# Patient Record
Sex: Female | Born: 1965 | Race: White | Hispanic: No | Marital: Single | Smoking: Never smoker
Health system: Southern US, Community
[De-identification: ages and names within clinical notes are randomized; demographics above are authoritative.]

## PROBLEM LIST (undated history)

## (undated) DIAGNOSIS — I219 Acute myocardial infarction, unspecified: Secondary | ICD-10-CM

## (undated) DIAGNOSIS — F329 Major depressive disorder, single episode, unspecified: Secondary | ICD-10-CM

## (undated) DIAGNOSIS — I10 Essential (primary) hypertension: Secondary | ICD-10-CM

## (undated) DIAGNOSIS — F419 Anxiety disorder, unspecified: Secondary | ICD-10-CM

## (undated) DIAGNOSIS — F32A Depression, unspecified: Secondary | ICD-10-CM

## (undated) DIAGNOSIS — I471 Supraventricular tachycardia, unspecified: Secondary | ICD-10-CM

## (undated) DIAGNOSIS — G44009 Cluster headache syndrome, unspecified, not intractable: Secondary | ICD-10-CM

## (undated) DIAGNOSIS — I2510 Atherosclerotic heart disease of native coronary artery without angina pectoris: Secondary | ICD-10-CM

## (undated) DIAGNOSIS — E785 Hyperlipidemia, unspecified: Secondary | ICD-10-CM

## (undated) HISTORY — DX: Essential (primary) hypertension: I10

## (undated) HISTORY — DX: Hyperlipidemia, unspecified: E78.5

## (undated) HISTORY — DX: Anxiety disorder, unspecified: F41.9

## (undated) HISTORY — DX: Acute myocardial infarction, unspecified: I21.9

## (undated) HISTORY — DX: Supraventricular tachycardia: I47.1

## (undated) HISTORY — PX: MENISCUS REPAIR: SHX5179

## (undated) HISTORY — PX: OTHER SURGICAL HISTORY: SHX169

## (undated) HISTORY — DX: Major depressive disorder, single episode, unspecified: F32.9

## (undated) HISTORY — DX: Cluster headache syndrome, unspecified, not intractable: G44.009

## (undated) HISTORY — DX: Depression, unspecified: F32.A

## (undated) HISTORY — DX: Atherosclerotic heart disease of native coronary artery without angina pectoris: I25.10

## (undated) HISTORY — DX: Supraventricular tachycardia, unspecified: I47.10

---

## 1999-10-28 ENCOUNTER — Other Ambulatory Visit: Admission: RE | Admit: 1999-10-28 | Discharge: 1999-10-28 | Payer: Self-pay | Admitting: Internal Medicine

## 2002-11-22 ENCOUNTER — Emergency Department (HOSPITAL_COMMUNITY): Admission: EM | Admit: 2002-11-22 | Discharge: 2002-11-22 | Payer: Self-pay | Admitting: Emergency Medicine

## 2002-11-22 ENCOUNTER — Encounter: Payer: Self-pay | Admitting: Emergency Medicine

## 2004-01-14 ENCOUNTER — Other Ambulatory Visit: Admission: RE | Admit: 2004-01-14 | Discharge: 2004-01-14 | Payer: Self-pay | Admitting: Gynecology

## 2011-01-06 ENCOUNTER — Other Ambulatory Visit: Payer: Self-pay | Admitting: Family Medicine

## 2011-01-06 ENCOUNTER — Ambulatory Visit
Admission: RE | Admit: 2011-01-06 | Discharge: 2011-01-06 | Disposition: A | Discharge status: Home / Self Care | Payer: BC Managed Care – PPO | Source: Ambulatory Visit | Attending: Family Medicine | Admitting: Family Medicine

## 2011-01-06 DIAGNOSIS — R1011 Right upper quadrant pain: Secondary | ICD-10-CM

## 2011-10-06 ENCOUNTER — Encounter: Payer: BC Managed Care – PPO | Admitting: Physician Assistant

## 2011-10-11 DIAGNOSIS — G44009 Cluster headache syndrome, unspecified, not intractable: Secondary | ICD-10-CM | POA: Insufficient documentation

## 2011-10-11 DIAGNOSIS — I10 Essential (primary) hypertension: Secondary | ICD-10-CM | POA: Insufficient documentation

## 2011-10-12 ENCOUNTER — Ambulatory Visit (INDEPENDENT_AMBULATORY_CARE_PROVIDER_SITE_OTHER): Payer: BC Managed Care – PPO | Admitting: Gynecology

## 2011-10-12 ENCOUNTER — Encounter: Payer: Self-pay | Admitting: Gynecology

## 2011-10-12 ENCOUNTER — Other Ambulatory Visit (HOSPITAL_COMMUNITY)
Admission: RE | Admit: 2011-10-12 | Discharge: 2011-10-12 | Disposition: A | Discharge status: Home / Self Care | Payer: BC Managed Care – PPO | Source: Ambulatory Visit | Attending: Gynecology | Admitting: Gynecology

## 2011-10-12 VITALS — BP 150/96 | Ht 60.5 in | Wt 166.0 lb

## 2011-10-12 DIAGNOSIS — Z01419 Encounter for gynecological examination (general) (routine) without abnormal findings: Secondary | ICD-10-CM

## 2011-10-12 DIAGNOSIS — E785 Hyperlipidemia, unspecified: Secondary | ICD-10-CM | POA: Insufficient documentation

## 2011-10-12 NOTE — Patient Instructions (Signed)
Contraceptive Devices (IUD) IUD stands for intrauterine device. Intrauterine means inside the womb (uterus). The purpose of the IUD is to prevent pregnancy. IUDs make it more difficult for your partner's sperm to get into your womb and into your fallopian tubes, where the eggs are fertilized. IUDs also alter the secretions of your cervix, which make it a stronger sperm barrier. They also affect the lining of the womb, so it is harder for an egg to implant. The IUD does not cause an abortion. There are 2 types of IUDs available:  Copper IUD gives off a small amount of copper inside the uterus. This prevents the sperm from going through the uterus, up into the fallopian tube, where the egg is fertilized. The copper IUD can also damage or prevent the fertilized egg from attaching on the inside lining of the uterus. It can stay in place for 10 years. The copper IUD can be used as an emergency contraceptive, if inserted within 5 days after having unprotected sexual intercourse.   Hormone IUD contains progestin (synthetic progesterone), and it releases this hormone into the uterus. The hormone thickens the mucus on the cervix and prevents sperm from entering the uterus. It also weakens the sperm that get into the uterus, so that the sperm and egg cannot live in the fallopian tube. It also makes the inside lining of the uterus thinner, which makes it difficult for a fertilized egg to attach to the uterus. The hormone progestin in the IUD decreases the amount of bleeding during a menstrual period and can be helpful in women who have heavy menstrual periods. The hormone IUD can stay in place for 5 years.  SIDE EFFECTS OF THE IUD:  There may be more cramping or pain with periods.   It may cause heavier, longer periods, which can cause lack of red blood cells (anemia) and can interfere with your daily and sexual activities.  This method of birth control is not usually the best choice for a woman with heavy or  prolonged periods. The birth control pill may be a better choice. IUDs work best for women who have already had a pregnancy, because the cervix is more open, making the insertion of the device easier and less painful. However, many women without children use the IUD. One of the main goals of patient selection is to prevent unintentionally inserting an IUD into a patient who has an STD (sexually transmitted disease), who is at high risk of exposure to an STD, or who is already pregnant. That is why the IUD is inserted during, or right after, a menstrual period. REASONS NOT TO USE AN IUD:  The womb or cervix is not shaped normally.   You have or have had a pelvic infection, such as an STD, in the past 3 months.   You have or suspect cancer in the female organs.   You have an abnormal Pap smear.   You have certain liver diseases.   There is severe infection or inflammation of the cervix (cervicitis).   You have unexplained vaginal bleeding.   You have heart valve problems (unless a heart specialist advises otherwise).   You are allergic to copper (rare).   You previously had a pregnancy outside the uterus (ectopic).   You are pregnant or suspect you are pregnant.   You have prolonged or heavy periods, or heavy pain or cramping with periods (except for the hormone IUD).   You have or suspect pelvic cancer.   You have an   STD.   The cervix or uterus has problems (cervical stenosis, fibroids in the uterus) making it difficult to insert the IUD.  IUDs should be removed when a woman becomes menopausal or pregnant. BENEFITS OF THE IUD:  You are always ready to have sexual intercourse.   The copper IUD does not interfere with your female hormones.   The copper IUD can be used as emergency contraception.   An IUD can be used while nursing.   It works immediately after insertion, and there is no problem getting pregnant when it is removed.   It does not interfere with foreplay.    The progesterone IUD can make heavy menstrual periods lighter.   The progesterone IUD can be used for 5 years.   The copper IUD can be used for 10 years.  RISKS AND COMPLICATIONS  Putting the IUD through the uterus, into the pelvis or abdomen (perforation of the uterus).   Losing the IUD (expulsion). This is more common in women who never had children.   When pregnant with an IUD, there is an increased chance of an infection and loss of the pregnancy.   Pregnancy in the fallopian tube (ectopic).   STD, in women who have more than 1 sex partner. The IUD does not protect against STDs.   Other minor side effects may include:   Headaches.   Feeling sick to your stomach (nausea).   Breast tenderness.   Depression.  PROCEDURE   The IUD is inserted during or right after a menstrual period, to make sure you are not pregnant.   You will lie on an exam table, naked from the waist down.   Your caregiver will do an exam to determine the size and position of your uterus.   Usually, an anesthetic is not needed.   Your caregiver may give you a pain pill to take, 1 or 2 hours before the procedure.   Sometimes, a paracervical block may be used to block and control any discomfort with insertion.   A tool (speculum) is then placed in your vagina (birth canal) so your caregiver can see the cervix.   A sound is sent into the uterus to check the depth of the uterus.   A slim instrument (IUD inserter), which is shaped like a drinking straw, is inserted through the small opening in your cervix and into your uterus.   Then, the IUD is pushed in with a plunger, much like a syringe, and the inserter is removed. There may be some cramping and pain during the insertion.   Relaxing helps to lessen the discomfort.   Following the procedure, you will usually spot blood. Have some pads with you. Avoid using tampons for 2 weeks. Bleeding after the procedure is normal. It varies from light  spotting for a few days to menstrual-like bleeding for up to 3 weeks. It may be like a period if it is near the time for your normal period.   You may also have mild cramping. Only take over-the-counter or prescription medicines for pain, discomfort, or fever as directed by your caregiver. Do not use aspirin, as this may increase bleeding.   Practice checking the string coming out of the cervix, to make sure the IUD is always in the uterus.  HOME CARE INSTRUCTIONS   Do not drive for 24 hours.   Only take over-the-counter or prescription medicines for pain, discomfort, or fever as directed by your caregiver.   No tampons, douching, or sexual intercourse for   2 weeks, or until your caregiver approves.   Rest at home for 24 hours. You may then resume normal activities, unless told otherwise by your caregiver.   Check your IUD prior to resuming sexual activity, to make sure it is in place. Make sure that you can feel the strings. An IUD can be pushed out and lost without the user even knowing it is gone. Also, if the strings are getting longer, it may mean that the IUD is being forced out of the uterus. This means it is no longer offering full protection from pregnancy.   Take any medications your caregiver has ordered, as directed.   Make sure to keep your recheck appointment, so your caregiver can make sure your IUD has remained in place. After that exam, yearly exams are advised, unless you cannot feel the strings of your IUD.   Check that the IUD is still in place by feeling for the strings after every menstrual period.  SEEK MEDICAL CARE IF:   Bleeding is heavier than a normal menstrual cycle.   You have an oral temperature above 102 F (38.9 C).   You have increasing cramps or pains, not relieved with medicine. Or you develop belly (abdominal) pain that does not seem to be related to the same area of earlier cramping and pain.   You are lightheaded, unusually weak, or have fainting  episodes.   You develop pain in the tops of your shoulders (shoulder strap areas).   You are having problems or questions, which have not been answered well enough by your caregiver.   You develop abdominal pain.   You have pain during sexual intercourse.   You cannot feel the IUD strings.   You have abnormal vaginal discharge.   You feel the IUD at the opening of the cervix in the vagina.   You think you are pregnant.   You miss your menstrual period.   The IUD string is hurting your sex partner.   The IUD string has gotten longer.  Document Released: 07/25/2004 Document Revised: 12/06/2010 Document Reviewed: 09/06/2009 [employer] Patient Information 2012 [employer]. 

## 2011-10-12 NOTE — Progress Notes (Signed)
Teresa Wolf 1966-06-11 [id number]   History:    46 y.o.  for annual exam has not been seen the office since 2005. Patient has history of depression and has been followed in the past by Dr. Cleone Slim and her primary provider recently did all her lab work in his been treating her for hypercholesterolemia and hypertension. Patient is interested in the IUD and came for her annual exam as well as to discuss contraceptive options. She has never had a prior mammogram although she does her monthly self breast examination. Her cycles are regular.  Past medical history,surgical history, family history and social history were all reviewed and documented in the EPIC chart.  Gynecologic History Patient's last menstrual period was 09/17/2011. Contraception: none Last Pap: 2005. Results were: normal Last mammogram: No prior study. Results were: No prior study  Obstetric History OB History    Grav Para Term Preterm Abortions TAB SAB Ect Mult Living   1 1  1      1      # Outc Date GA Lbr Len/2nd Wgt Sex Del Anes PTL Lv   1 PRE     F CS  Yes Yes       ROS:  Was performed and pertinent positives and negatives are included in the history.  Exam: chaperone present  BP 150/96  Ht 5' 0.5" (1.537 m)  Wt 166 lb (75.297 kg)  BMI 31.89 kg/m2  LMP 09/17/2011  Body mass index is 31.89 kg/(m^2).  General appearance : Well developed well nourished female. No acute distress HEENT: Neck supple, trachea midline, no carotid bruits, no thyroidmegaly Lungs: Clear to auscultation, no rhonchi or wheezes, or rib retractions  Heart: Regular rate and rhythm, no murmurs or gallops Breast:Examined in sitting and supine position were symmetrical in appearance, no palpable masses or tenderness,  no skin retraction, no nipple inversion, no nipple discharge, no skin discoloration, no axillary or supraclavicular lymphadenopathy Abdomen: no palpable masses or tenderness, no rebound or guarding Extremities: no edema or skin  discoloration or tenderness  Pelvic:  Bartholin, Urethra, Skene Glands: Within normal limits             Vagina: No gross lesions or discharge  Cervix: No gross lesions or discharge  Uterus  anteverted, normal size, shape and consistency, non-tender and mobile  Adnexa  Without masses or tenderness  Anus and perineum  normal   Rectovaginal  normal sphincter tone without palpated masses or tenderness             Hemoccult not done     Assessment/Plan:  46 y.o. female for annual exam unremarkable. We discussed different types of IUDs. She is interested in the ParaGard T380A IUD for which literature formation was provided. Risks benefits and pros and cons as well as failure rates were discussed. Her Pap smear was done today but no lab work. She was given a requisition to schedule her mammogram. We'll see her at the end of the month and she start her menses to place the IUD.    Ok Edwards MD, 12:42 PM 10/12/2011

## 2011-10-18 ENCOUNTER — Inpatient Hospital Stay (HOSPITAL_COMMUNITY): Payer: BC Managed Care – PPO

## 2011-10-18 ENCOUNTER — Other Ambulatory Visit: Payer: Self-pay

## 2011-10-18 ENCOUNTER — Encounter (HOSPITAL_COMMUNITY): Payer: Self-pay | Admitting: Emergency Medicine

## 2011-10-18 ENCOUNTER — Inpatient Hospital Stay (HOSPITAL_COMMUNITY)
Admission: EM | Admit: 2011-10-18 | Discharge: 2011-10-20 | DRG: 550 | Disposition: A | Discharge status: Home / Self Care | Payer: BC Managed Care – PPO | Source: Ambulatory Visit | Attending: Cardiovascular Disease | Admitting: Cardiovascular Disease

## 2011-10-18 ENCOUNTER — Encounter (HOSPITAL_COMMUNITY)
Admission: EM | Disposition: A | Discharge status: Home / Self Care | Payer: Self-pay | Source: Ambulatory Visit | Attending: Cardiovascular Disease

## 2011-10-18 DIAGNOSIS — I2109 ST elevation (STEMI) myocardial infarction involving other coronary artery of anterior wall: Principal | ICD-10-CM | POA: Diagnosis present

## 2011-10-18 DIAGNOSIS — E785 Hyperlipidemia, unspecified: Secondary | ICD-10-CM | POA: Insufficient documentation

## 2011-10-18 DIAGNOSIS — I213 ST elevation (STEMI) myocardial infarction of unspecified site: Secondary | ICD-10-CM

## 2011-10-18 DIAGNOSIS — F3289 Other specified depressive episodes: Secondary | ICD-10-CM | POA: Diagnosis present

## 2011-10-18 DIAGNOSIS — D72829 Elevated white blood cell count, unspecified: Secondary | ICD-10-CM | POA: Diagnosis not present

## 2011-10-18 DIAGNOSIS — Z79899 Other long term (current) drug therapy: Secondary | ICD-10-CM

## 2011-10-18 DIAGNOSIS — E7800 Pure hypercholesterolemia, unspecified: Secondary | ICD-10-CM

## 2011-10-18 DIAGNOSIS — I471 Supraventricular tachycardia, unspecified: Secondary | ICD-10-CM | POA: Diagnosis not present

## 2011-10-18 DIAGNOSIS — I2582 Chronic total occlusion of coronary artery: Secondary | ICD-10-CM | POA: Diagnosis present

## 2011-10-18 DIAGNOSIS — J189 Pneumonia, unspecified organism: Secondary | ICD-10-CM | POA: Diagnosis not present

## 2011-10-18 DIAGNOSIS — Z7982 Long term (current) use of aspirin: Secondary | ICD-10-CM

## 2011-10-18 DIAGNOSIS — I059 Rheumatic mitral valve disease, unspecified: Secondary | ICD-10-CM | POA: Diagnosis present

## 2011-10-18 DIAGNOSIS — I2510 Atherosclerotic heart disease of native coronary artery without angina pectoris: Secondary | ICD-10-CM

## 2011-10-18 DIAGNOSIS — I498 Other specified cardiac arrhythmias: Secondary | ICD-10-CM | POA: Diagnosis not present

## 2011-10-18 DIAGNOSIS — Z7902 Long term (current) use of antithrombotics/antiplatelets: Secondary | ICD-10-CM

## 2011-10-18 DIAGNOSIS — I10 Essential (primary) hypertension: Secondary | ICD-10-CM | POA: Insufficient documentation

## 2011-10-18 DIAGNOSIS — Z880 Allergy status to penicillin: Secondary | ICD-10-CM

## 2011-10-18 DIAGNOSIS — F411 Generalized anxiety disorder: Secondary | ICD-10-CM | POA: Diagnosis present

## 2011-10-18 DIAGNOSIS — F329 Major depressive disorder, single episode, unspecified: Secondary | ICD-10-CM | POA: Diagnosis present

## 2011-10-18 HISTORY — PX: PERCUTANEOUS CORONARY STENT INTERVENTION (PCI-S): SHX5485

## 2011-10-18 HISTORY — PX: LEFT HEART CATHETERIZATION WITH CORONARY ANGIOGRAM: SHX5451

## 2011-10-18 LAB — BASIC METABOLIC PANEL
BUN: 6 mg/dL (ref 6–23)
Calcium: 8.7 mg/dL (ref 8.4–10.5)
Creatinine, Ser: 0.62 mg/dL (ref 0.50–1.10)
GFR calc Af Amer: >90 mL/min (ref >90)
GFR calc non Af Amer: >90 mL/min (ref >90)

## 2011-10-18 LAB — CBC
HCT: 38.6 % (ref 36.0–46.0)
MCHC: 33.2 g/dL (ref 30.0–36.0)
MCV: 85 fL (ref 78.0–100.0)
RDW: 12.7 % (ref 11.5–15.5)

## 2011-10-18 LAB — POCT I-STAT, CHEM 8
Creatinine, Ser: 0.7 mg/dL (ref 0.50–1.10)
Hemoglobin: 13.9 g/dL (ref 12.0–15.0)
Sodium: 139 mEq/L (ref 135–145)
TCO2: 19 mmol/L (ref 0–100)

## 2011-10-18 LAB — GLUCOSE, CAPILLARY: Glucose-Capillary: 187 mg/dL — ABNORMAL HIGH (ref 70–99)

## 2011-10-18 LAB — LIPID PANEL
HDL: 46 mg/dL (ref >39)
LDL Cholesterol: 119 mg/dL — ABNORMAL HIGH (ref 0–99)
Triglycerides: 76 mg/dL (ref <150)

## 2011-10-18 LAB — POCT ACTIVATED CLOTTING TIME: Activated Clotting Time: 441 seconds

## 2011-10-18 SURGERY — LEFT HEART CATHETERIZATION WITH CORONARY ANGIOGRAM
Anesthesia: LOCAL

## 2011-10-18 MED ORDER — METOPROLOL TARTRATE 1 MG/ML IV SOLN
5.0000 mg | Freq: Once | INTRAVENOUS | Status: AC
  Administered 2011-10-18: 5 mg via INTRAVENOUS

## 2011-10-18 MED ORDER — HEPARIN (PORCINE) IN NACL 2-0.9 UNIT/ML-% IJ SOLN
INTRAMUSCULAR | Status: AC
  Filled 2011-10-18: qty 2000

## 2011-10-18 MED ORDER — ACETAMINOPHEN 325 MG PO TABS: 650.0000 mg | ORAL_TABLET | ORAL | Status: DC | PRN

## 2011-10-18 MED ORDER — ASPIRIN 300 MG RE SUPP
300.0000 mg | RECTAL | Status: AC
  Filled 2011-10-18: qty 1

## 2011-10-18 MED ORDER — CITALOPRAM HYDROBROMIDE 20 MG PO TABS
20.0000 mg | ORAL_TABLET | Freq: Every day | ORAL | Status: DC
  Administered 2011-10-18 – 2011-10-20 (×3): 20 mg via ORAL
  Filled 2011-10-18 (×3): qty 1

## 2011-10-18 MED ORDER — HEPARIN SODIUM (PORCINE) 5000 UNIT/ML IJ SOLN
INTRAMUSCULAR | Status: AC
  Administered 2011-10-18: 5000 [IU] via INTRAVENOUS
  Filled 2011-10-18: qty 1

## 2011-10-18 MED ORDER — BIVALIRUDIN 250 MG IV SOLR
INTRAVENOUS | Status: AC
  Filled 2011-10-18: qty 250

## 2011-10-18 MED ORDER — SODIUM CHLORIDE 0.9 % IV SOLN: 250.0000 mL | INTRAVENOUS | Status: DC

## 2011-10-18 MED ORDER — METOPROLOL TARTRATE 1 MG/ML IV SOLN
INTRAVENOUS | Status: AC
  Filled 2011-10-18: qty 5

## 2011-10-18 MED ORDER — FENTANYL CITRATE 0.05 MG/ML IJ SOLN
INTRAMUSCULAR | Status: AC
  Filled 2011-10-18: qty 2

## 2011-10-18 MED ORDER — LIDOCAINE HCL (PF) 1 % IJ SOLN
INTRAMUSCULAR | Status: AC
  Filled 2011-10-18: qty 30

## 2011-10-18 MED ORDER — ZOLPIDEM TARTRATE 5 MG PO TABS
10.0000 mg | ORAL_TABLET | Freq: Every evening | ORAL | Status: DC | PRN
  Administered 2011-10-18: 10 mg via ORAL
  Filled 2011-10-18: qty 2

## 2011-10-18 MED ORDER — ASPIRIN EC 81 MG PO TBEC: 81.0000 mg | DELAYED_RELEASE_TABLET | Freq: Every day | ORAL | Status: DC

## 2011-10-18 MED ORDER — LORAZEPAM 0.5 MG PO TABS
1.0000 mg | ORAL_TABLET | Freq: Two times a day (BID) | ORAL | Status: DC
  Administered 2011-10-18 – 2011-10-20 (×5): 1 mg via ORAL
  Filled 2011-10-18: qty 1
  Filled 2011-10-18: qty 2
  Filled 2011-10-18: qty 1
  Filled 2011-10-18: qty 2
  Filled 2011-10-18: qty 1

## 2011-10-18 MED ORDER — ADENOSINE 12 MG/4ML IV SOLN
12.0000 mg | Freq: Once | INTRAVENOUS | Status: AC
  Administered 2011-10-18: 12 mg via INTRAVENOUS

## 2011-10-18 MED ORDER — ASPIRIN EC 325 MG PO TBEC
325.0000 mg | DELAYED_RELEASE_TABLET | Freq: Every day | ORAL | Status: DC
  Administered 2011-10-18: 325 mg via ORAL
  Filled 2011-10-18: qty 1

## 2011-10-18 MED ORDER — SODIUM CHLORIDE 0.9 % IJ SOLN: 3.0000 mL | INTRAMUSCULAR | Status: DC | PRN

## 2011-10-18 MED ORDER — MIDAZOLAM HCL 2 MG/2ML IJ SOLN
INTRAMUSCULAR | Status: AC
  Filled 2011-10-18: qty 2

## 2011-10-18 MED ORDER — ONDANSETRON HCL 4 MG/2ML IJ SOLN
INTRAMUSCULAR | Status: AC
  Administered 2011-10-18: 4 mg
  Filled 2011-10-18: qty 2

## 2011-10-18 MED ORDER — ADENOSINE 6 MG/2ML IV SOLN
INTRAVENOUS | Status: AC
  Filled 2011-10-18: qty 2

## 2011-10-18 MED ORDER — NITROGLYCERIN IN D5W 200-5 MCG/ML-% IV SOLN
INTRAVENOUS | Status: AC
  Filled 2011-10-18: qty 250

## 2011-10-18 MED ORDER — METOPROLOL SUCCINATE ER 50 MG PO TB24: 50.0000 mg | ORAL_TABLET | Freq: Every day | ORAL | Status: DC

## 2011-10-18 MED ORDER — NITROGLYCERIN 0.2 MG/ML ON CALL CATH LAB
INTRAVENOUS | Status: AC
  Filled 2011-10-18: qty 1

## 2011-10-18 MED ORDER — METOPROLOL TARTRATE 50 MG PO TABS
50.0000 mg | ORAL_TABLET | Freq: Two times a day (BID) | ORAL | Status: DC
  Administered 2011-10-18 – 2011-10-20 (×5): 50 mg via ORAL
  Filled 2011-10-18 (×7): qty 1

## 2011-10-18 MED ORDER — METOPROLOL SUCCINATE ER 50 MG PO TB24
50.0000 mg | ORAL_TABLET | Freq: Every day | ORAL | Status: DC
  Administered 2011-10-18: 50 mg via ORAL
  Filled 2011-10-18: qty 1

## 2011-10-18 MED ORDER — ASPIRIN 81 MG PO CHEW
CHEWABLE_TABLET | ORAL | Status: AC
  Filled 2011-10-18: qty 4

## 2011-10-18 MED ORDER — ASPIRIN 81 MG PO CHEW
81.0000 mg | CHEWABLE_TABLET | Freq: Every day | ORAL | Status: DC
  Administered 2011-10-19 – 2011-10-20 (×2): 81 mg via ORAL
  Filled 2011-10-18 (×2): qty 1

## 2011-10-18 MED ORDER — ADENOSINE 6 MG/2ML IV SOLN
6.0000 mg | Freq: Once | INTRAVENOUS | Status: AC
  Administered 2011-10-18: 6 mg via INTRAVENOUS

## 2011-10-18 MED ORDER — SODIUM CHLORIDE 0.9 % IJ SOLN
3.0000 mL | Freq: Two times a day (BID) | INTRAMUSCULAR | Status: DC
  Administered 2011-10-18 – 2011-10-20 (×5): 3 mL via INTRAVENOUS

## 2011-10-18 MED ORDER — ONDANSETRON HCL 4 MG/2ML IJ SOLN: 4.0000 mg | Freq: Four times a day (QID) | INTRAMUSCULAR | Status: DC | PRN

## 2011-10-18 MED ORDER — ASPIRIN 81 MG PO CHEW
324.0000 mg | CHEWABLE_TABLET | ORAL | Status: AC
  Administered 2011-10-18: 324 mg via ORAL
  Filled 2011-10-18: qty 4

## 2011-10-18 MED ORDER — LABETALOL HCL 5 MG/ML IV SOLN
INTRAVENOUS | Status: AC
  Filled 2011-10-18: qty 4

## 2011-10-18 MED ORDER — NITROGLYCERIN 0.4 MG SL SUBL: 0.4000 mg | SUBLINGUAL_TABLET | SUBLINGUAL | Status: DC | PRN

## 2011-10-18 MED ORDER — ACETAMINOPHEN 325 MG PO TABS
650.0000 mg | ORAL_TABLET | ORAL | Status: DC | PRN
  Administered 2011-10-19: 650 mg via ORAL
  Filled 2011-10-18: qty 2

## 2011-10-18 MED ORDER — CLOPIDOGREL BISULFATE 300 MG PO TABS
ORAL_TABLET | ORAL | Status: AC
  Filled 2011-10-18: qty 2

## 2011-10-18 MED ORDER — HEPARIN SODIUM (PORCINE) 5000 UNIT/ML IJ SOLN
5000.0000 [IU] | Freq: Three times a day (TID) | INTRAMUSCULAR | Status: DC
  Administered 2011-10-18 – 2011-10-20 (×8): 5000 [IU] via SUBCUTANEOUS
  Filled 2011-10-18 (×12): qty 1

## 2011-10-18 MED ORDER — CLOPIDOGREL BISULFATE 75 MG PO TABS
75.0000 mg | ORAL_TABLET | Freq: Every day | ORAL | Status: DC
  Administered 2011-10-18 – 2011-10-20 (×3): 75 mg via ORAL
  Filled 2011-10-18 (×3): qty 1

## 2011-10-18 MED ORDER — ALPRAZOLAM 0.25 MG PO TABS
0.2500 mg | ORAL_TABLET | Freq: Two times a day (BID) | ORAL | Status: DC | PRN
  Administered 2011-10-18: 0.25 mg via ORAL
  Filled 2011-10-18: qty 1

## 2011-10-18 MED ORDER — METOPROLOL TARTRATE 1 MG/ML IV SOLN
INTRAVENOUS | Status: AC
  Administered 2011-10-18: 5 mg via INTRAVENOUS
  Filled 2011-10-18: qty 10

## 2011-10-18 MED ORDER — SODIUM CHLORIDE 0.9 % IV SOLN
1.0000 mL/kg/h | INTRAVENOUS | Status: AC
  Administered 2011-10-18: 1 mL/kg/h via INTRAVENOUS

## 2011-10-18 MED ORDER — ROSUVASTATIN CALCIUM 40 MG PO TABS
40.0000 mg | ORAL_TABLET | Freq: Every day | ORAL | Status: DC
  Administered 2011-10-18 – 2011-10-20 (×3): 40 mg via ORAL
  Filled 2011-10-18 (×3): qty 1

## 2011-10-18 MED ORDER — ADENOSINE 6 MG/2ML IV SOLN
INTRAVENOUS | Status: AC
  Administered 2011-10-18: 6 mg via INTRAVENOUS
  Filled 2011-10-18: qty 2

## 2011-10-18 MED ORDER — ASPIRIN 325 MG PO TABS: 325.0000 mg | ORAL_TABLET | Freq: Every day | ORAL | Status: DC

## 2011-10-18 NOTE — Interval H&P Note (Signed)
History and Physical Interval Note:  10/18/2011 4:17 AM  Teresa Wolf  has presented today for surgery, with the diagnosis of stemi  The various methods of treatment have been discussed with the patient and family. After consideration of risks, benefits and other options for treatment, the patient has consented to  Procedure(s) (LRB): LEFT HEART CATHETERIZATION WITH CORONARY ANGIOGRAM (N/A) PERCUTANEOUS CORONARY STENT INTERVENTION (PCI-S) () as a surgical intervention .  The patients' history has been reviewed, patient examined, no change in status, stable for surgery.  I have reviewed the patients' chart and labs.  Questions were answered to the patient's satisfaction.     Tonny Bollman

## 2011-10-18 NOTE — Progress Notes (Signed)
  Echocardiogram 2D Echocardiogram has been performed.  Teresa Wolf 10/18/2011, 2:46 PM

## 2011-10-18 NOTE — ED Notes (Signed)
Dr. Bensimohn at bedside.  

## 2011-10-18 NOTE — Progress Notes (Addendum)
PCP: [name] NP (switching)  HPI:  46 y/o woman with h/o headaches, HTN and HL. No known CAD.  Was at friend's house at 10:30p developed CP. Thought it was her gallbladder but CP persisted. Called EMS. ECG with prominent ST elevation antero-laterally with some ST elevation inferiorly. Treated with asa 325 x 4, NTG and heparin. On arrival to ER CP 1/10. Now pain free.   CATH: 2/13. Left mainstem: Widely patent with an acentric orifice, but the diarrhea flux and no pressure damping.  Left anterior descending (LAD): The LAD is widely patent in the proximal aspect. Just after the first diagonal branch, the vessel is totally occluded with appearance of thrombus. There is TIMI 0 flow.  Left circumflex (LCx): The left circumflex is widely patent. There is no significant obstructive disease. The vessel is large and smooth throughout its course. There are 2 large obtuse marginal branches with no significant stenosis.  Right coronary artery (RCA): The RCA is dominant. The vessel is smooth throughout its course. There is mild catheter induced vasospasm. The PDA is widely patent. There is no obstructive disease throughout the RCA distribution.  Left ventriculography: Left ventricular systolic function is severely reduced. There is a large area of akinesis from the mid anterior wall extending into the apex and distal inferior wall. The left ventricular ejection fraction is estimated at 35%. There is no significant mitral regurgitation.  S/p mid LAD stenting with DES.  No more CP, SOB N/V. Pt feels little anxious. Had multiple SVT episodes this am.    Past Medical History  Diagnosis Date  . Cluster headaches     sporadic  . Hypertension   . Depression   . Anxiety   . High cholesterol     Medications Prior to Admission  Medication Dose Route Frequency Provider Last Rate Last Dose  . 0.9 %  sodium chloride infusion  1 mL/kg/hr Intravenous Continuous Micheline Chapman, MD 73.6 mL/hr at  10/18/11 0445 1 mL/kg/hr at 10/18/11 0445  . 0.9 %  sodium chloride infusion  250 mL Intravenous Continuous Micheline Chapman, MD      . acetaminophen (TYLENOL) tablet 650 mg  650 mg Oral Q4H PRN Dolores Patty, MD      . acetaminophen (TYLENOL) tablet 650 mg  650 mg Oral Q4H PRN Micheline Chapman, MD      . ALPRAZolam Prudy Feeler) tablet 0.25 mg  0.25 mg Oral BID PRN Micheline Chapman, MD   0.25 mg at 10/18/11 0804  . aspirin chewable tablet 324 mg  324 mg Oral NOW Dolores Patty, MD   324 mg at 10/18/11 1610   Or  . aspirin suppository 300 mg  300 mg Rectal NOW Dolores Patty, MD      . aspirin EC tablet 325 mg  325 mg Oral Daily Micheline Chapman, MD   325 mg at 10/18/11 0926  . bivalirudin (ANGIOMAX) 250 MG injection           . citalopram (CELEXA) tablet 20 mg  20 mg Oral Daily Dolores Patty, MD   20 mg at 10/18/11 9604  . clopidogrel (PLAVIX) 300 MG tablet           . clopidogrel (PLAVIX) tablet 75 mg  75 mg Oral Q breakfast Micheline Chapman, MD   75 mg at 10/18/11 0804  . fentaNYL (SUBLIMAZE) 0.05 MG/ML injection           . heparin 2-0.9 UNIT/ML-%  infusion           . heparin 5000 UNIT/ML injection        5,000 Units at 10/18/11 0253  . heparin injection 5,000 Units  5,000 Units Subcutaneous Q8H Dolores Patty, MD   5,000 Units at 10/18/11 0630  . labetalol (NORMODYNE,TRANDATE) 5 MG/ML injection           . lidocaine (XYLOCAINE) 1 % injection           . LORazepam (ATIVAN) tablet 1 mg  1 mg Oral BID Dolores Patty, MD   1 mg at 10/18/11 9604  . metoprolol (LOPRESSOR) injection 5 mg  5 mg Intravenous Once Lyn Hollingshead, MD   5 mg at 10/18/11 0258  . metoprolol succinate (TOPROL-XL) 24 hr tablet 50 mg  50 mg Oral Daily Dolores Patty, MD   50 mg at 10/18/11 0927  . midazolam (VERSED) 2 MG/2ML injection           . midazolam (VERSED) 2 MG/2ML injection           . nitroGLYCERIN (NITROSTAT) SL tablet 0.4 mg  0.4 mg Sublingual Q5 Min x 3 PRN Dolores Patty, MD      .  nitroGLYCERIN (NTG ON-CALL) 0.2 mg/mL injection           . nitroGLYCERIN 0.2 mg/mL in dextrose 5 % infusion           . ondansetron (ZOFRAN) 4 MG/2ML injection        4 mg at 10/18/11 0230  . ondansetron (ZOFRAN) injection 4 mg  4 mg Intravenous Q6H PRN Dolores Patty, MD      . ondansetron [facility]) injection 4 mg  4 mg Intravenous Q6H PRN Micheline Chapman, MD      . rosuvastatin (CRESTOR) tablet 40 mg  40 mg Oral Daily Dolores Patty, MD   40 mg at 10/18/11 5409  . sodium chloride 0.9 % injection 3 mL  3 mL Intravenous Q12H Micheline Chapman, MD      . sodium chloride 0.9 % injection 3 mL  3 mL Intravenous PRN Micheline Chapman, MD      . zolpidem [facility]) tablet 10 mg  10 mg Oral QHS PRN Micheline Chapman, MD      . DISCONTD: aspirin 81 MG chewable tablet           . DISCONTD: aspirin EC tablet 81 mg  81 mg Oral Daily Dolores Patty, MD      . DISCONTD: aspirin tablet 325 mg  325 mg Oral Daily Dolores Patty, MD      . DISCONTD: metoprolol succinate (TOPROL-XL) 24 hr tablet 50 mg  50 mg Oral Daily Dolores Patty, MD       Medications Prior to Admission  Medication Sig Dispense Refill  . aspirin 325 MG tablet Take 325 mg by mouth daily.      . Calcium Carbonate-Vit D-Min (CALCIUM 1200 PO) Take by mouth.      . citalopram (CELEXA) 20 MG tablet Take 20 mg by mouth daily.      Marland Kitchen lamoTRIgine (LAMICTAL) 25 MG tablet       . LORazepam (ATIVAN) 1 MG tablet Take 1 mg by mouth 2 (two) times daily.      . Metoprolol Succinate (TOPROL XL PO) Take 50 mg by mouth daily.       . niacin-simvastatin (SIMCOR) 1000-20 MG 24 hr tablet Take 1 tablet by  mouth at bedtime.      Marland Kitchen zolpidem (AMBIEN) 10 MG tablet           PHYSICAL EXAM: Filed Vitals:   10/18/11 1001  BP: 128/87  Pulse: 88  Temp:   Resp: 19   General:  Well appearing. Anxious. HEENT: normal Neck: supple. no JVD. Carotids 2+ bilat; no bruits. No lymphadenopathy or thryomegaly appreciated. Cor: PMI nondisplaced.  Severely tachy. No rubs, gallops or murmurs. Lungs: CTAB, no wheezes or rales. Abdomen: soft, nontender, nondistended. No hepatosplenomegaly. No bruits or masses. Good bowel sounds. Extremities: no cyanosis, clubbing, rash, edema Neuro: alert & oriented x 3, cranial nerves grossly intact. moves all 4 extremities w/o difficulty. Affect pleasant.  ECG: on admission: SR prominent anterolateral ST elevation 10:00 am 2/13: SVT with no ST elevation.  Results for orders placed during the hospital encounter of 10/18/11 (from the past 24 hour(s))  POCT I-STAT, CHEM 8     Status: Abnormal   Collection Time   10/18/11  3:02 AM      Component Value Range   Sodium 139  135 - 145 (mEq/L)   Potassium 3.7  3.5 - 5.1 (mEq/L)   Chloride 107  96 - 112 (mEq/L)   BUN 5 (*) 6 - 23 (mg/dL)   Creatinine, Ser 1.19  0.50 - 1.10 (mg/dL)   Glucose, Bld 147 (*) 70 - 99 (mg/dL)   Calcium, Ion 8.29 (*) 1.12 - 1.32 (mmol/L)   TCO2 19  0 - 100 (mmol/L)   Hemoglobin 13.9  12.0 - 15.0 (g/dL)   HCT 56.2  13.0 - 86.5 (%)  MRSA PCR SCREENING     Status: Normal   Collection Time   10/18/11  4:37 AM      Component Value Range   MRSA by PCR NEGATIVE  NEGATIVE   CBC     Status: Abnormal   Collection Time   10/18/11  5:00 AM      Component Value Range   WBC 21.6 (*) 4.0 - 10.5 (K/uL)   RBC 4.54  3.87 - 5.11 (MIL/uL)   Hemoglobin 12.8  12.0 - 15.0 (g/dL)   HCT 78.4  69.6 - 29.5 (%)   MCV 85.0  78.0 - 100.0 (fL)   MCH 28.2  26.0 - 34.0 (pg)   MCHC 33.2  30.0 - 36.0 (g/dL)   RDW 28.4  13.2 - 44.0 (%)   Platelets 305  150 - 400 (K/uL)  BASIC METABOLIC PANEL     Status: Abnormal   Collection Time   10/18/11  5:00 AM      Component Value Range   Sodium 133 (*) 135 - 145 (mEq/L)   Potassium 3.7  3.5 - 5.1 (mEq/L)   Chloride 101  96 - 112 (mEq/L)   CO2 21  19 - 32 (mEq/L)   Glucose, Bld 130 (*) 70 - 99 (mg/dL)   BUN 6  6 - 23 (mg/dL)   Creatinine, Ser 1.02  0.50 - 1.10 (mg/dL)   Calcium 8.7  8.4 - 72.5 (mg/dL)   GFR  calc non Af Amer >90  >90 (mL/min)   GFR calc Af Amer >90  >90 (mL/min)  CARDIAC PANEL(CRET KIN+CKTOT+MB+TROPI)     Status: Abnormal   Collection Time   10/18/11  5:47 AM      Component Value Range   Total CK 2378 (*) 7 - 177 (U/L)   CK, MB 459.8 (*) 0.3 - 4.0 (ng/mL)   Troponin I >25.00 (*) <0.30 (ng/mL)  Relative Index 19.3 (*) 0.0 - 2.5   LIPID PANEL     Status: Abnormal   Collection Time   10/18/11  5:47 AM      Component Value Range   Cholesterol 180  0 - 200 (mg/dL)   Triglycerides 76  <409 (mg/dL)   HDL 46  >81 (mg/dL)   Total CHOL/HDL Ratio 3.9     VLDL 15  0 - 40 (mg/dL)   LDL Cholesterol 191 (*) 0 - 99 (mg/dL)   No results found.   ASSESSMENT and PLAN:   1. Anterolateral ST elevation : LAD Stent DES 2/13 as above. Single vessel disease.  Troponin  >25. No prior Hx of CAD. Hx of HTN and HLD.  Plan: - Dual Antiplatelet RX for 1 yr-  ASA, Plavix. - Cont Statin. - increase BB toprol 50 bid.  2.SVT: Adenosine 6 mg and then 12mg  IV push given. HR in 90's to low 100's after that. - Will increase dose of Metoprolol to 50 BID.  3. HTN: Cont BB - Will consider adding ACE once BP tolerates.  4. HLD: Cont statin  5: DVT prophylaxis: Heparin sq   PATEL,RAVI,MD 10:45 AM Patient seen and examined and history reviewed. Agree with above findings and plan. Patient developed SVT this am with rate 220 bpm. It broke with IV adenosine 6 then 12 mg. Has a prior hx of SVT. Tolerated it well. Exam is negative today. Groin ok. Will increase metoprolol. Add ACEi. Check CXR given elevated WBC. Given arrhythmia will watch in unit today.  Thedora Hinders 10/18/2011 11:58 AM

## 2011-10-18 NOTE — ED Provider Notes (Signed)
History     CSN: [id number]  Arrival date & time 10/18/11  0245   First MD Initiated Contact with Patient 10/18/11 0250      Chief Complaint  Patient presents with  . Code STEMI    (Consider location/radiation/quality/duration/timing/severity/associated sxs/prior treatment) HPI Comments: By EMS for eval of chest pain.  Prehospital ekg suggestive of acute mi.  No cardiac history.  Denies sob, cough.    Patient is a 46 y.o. female presenting with chest pain. The history is provided by the patient.  Chest Pain The chest pain began 3 - 5 hours ago. Chest pain occurs constantly. The chest pain is worsening. The severity of the pain is moderate. The quality of the pain is described as pressure-like. The pain does not radiate. Primary symptoms include fatigue. Pertinent negatives for primary symptoms include no fever and no shortness of breath. She tried nothing for the symptoms.  Her past medical history is significant for hyperlipidemia.     Past Medical History  Diagnosis Date  . Cluster headaches     sporadic  . Hypertension   . Depression   . Anxiety   . High cholesterol     Past Surgical History  Procedure Date  . Cesarean section     Family History  Problem Relation Age of Onset  . Osteoporosis Mother   . Breast cancer Maternal Aunt   . Diabetes Maternal Grandmother   . Hyperlipidemia Maternal Grandmother   . Cancer Maternal Grandmother     colon  . Heart disease Maternal Grandmother     great grandmother  . Cancer Paternal Grandfather     lung    History  Substance Use Topics  . Smoking status: Never Smoker   . Smokeless tobacco: Never Used  . Alcohol Use: Yes     SOCIAL    OB History    Grav Para Term Preterm Abortions TAB SAB Ect Mult Living   1 1  1      1       Review of Systems  Constitutional: Positive for fatigue. Negative for fever.  Respiratory: Negative for shortness of breath.   Cardiovascular: Positive for chest pain.  All other  systems reviewed and are negative.    Allergies  Penicillins  Home Medications   Current Outpatient Rx  Name Route Sig Dispense Refill  . ZOLPIDEM TARTRATE 10 MG PO TABS      . ASPIRIN 81 MG PO CHEW Oral Chew 324 mg by mouth once.    Marland Kitchen LAMOTRIGINE 25 MG PO TABS      . NITROGLYCERIN 0.4 MG SL SUBL Sublingual Place 0.4 mg under the tongue every 5 (five) minutes as needed. Chest pain Total of 4 given en route    . ONDANSETRON HCL 4 MG/2ML IJ SOLN Intravenous Inject 4 mg into the vein once.      BP 162/94  Temp(Src) 97.8 F (36.6 C) (Oral)  Resp 16  Ht 5\' 1"  (1.549 m)  Wt 162 lb 4.1 oz (73.6 kg)  BMI 30.66 kg/m2  SpO2 100%  LMP 09/17/2011  Physical Exam  Nursing note and vitals reviewed. Constitutional: She is oriented to person, place, and time. She appears well-developed and well-nourished. No distress.  HENT:  Head: Normocephalic and atraumatic.  Neck: Normal range of motion. Neck supple.  Cardiovascular: Normal rate and regular rhythm.  Exam reveals no gallop and no friction rub.   No murmur heard. Pulmonary/Chest: Effort normal and breath sounds normal. No respiratory distress. She  has no wheezes.  Abdominal: Soft. Bowel sounds are normal. She exhibits no distension. There is no tenderness.  Musculoskeletal: Normal range of motion.  Neurological: She is alert and oriented to person, place, and time.  Skin: Skin is warm and dry. She is not diaphoretic.    ED Course  Procedures (including critical care time)  Labs Reviewed  POCT I-STAT, CHEM 8 - Abnormal; Notable for the following:    BUN 5 (*)    Glucose, Bld 169 (*)    Calcium, Ion 1.07 (*)    All other components within normal limits   No results found.   No diagnosis found.   Date: 10/18/2011  Rate: 93  Rhythm: normal sinus rhythm  QRS Axis: normal  Intervals: normal  ST/T Wave abnormalities: ST elevations anteriorly  Conduction Disutrbances:none  Narrative Interpretation:   Old EKG Reviewed: none  available    MDM  Code STEMI called pre-hospital.  Dr. Teressa Lower in for evaluation.  Patient transported to the cath lab.        Geoffery Lyons, MD 10/18/11 [phone]

## 2011-10-18 NOTE — ED Notes (Signed)
To Cath lab per Dr Teressa Lower with RN and RR RN.

## 2011-10-18 NOTE — Progress Notes (Signed)
Chaplain Note.  Code STEMI page. Met Daughter & Neighbor in 2nd Floor consult room. Listened to the night's story. Spoke with Cath Lab staff 310am. Procedure just starting. Expect to take about 1 hour. Passed status info onto Daughter Geologist, engineering. Will follow up.

## 2011-10-18 NOTE — ED Notes (Signed)
Patient seen by EMS earlier tonight, did not want to go to hospital.  Patient called back due to continued to chest pain, radiating to back, was given 4 nitro, pain has reduced from 10/10 to 2/10.  Patient also received 4mg  Zofran for nausea and 324mg  of ASA.

## 2011-10-18 NOTE — Progress Notes (Signed)
Chaplain Note. Code STEMI page. Follow up visit. Spoke with doctor. Procedure finished, he spoke with family and they are in 2900 waiting room.  Met family again. Daughter is Herbalist. Pt received stint. Family seems calm. Pt father arrived. Spiritual Care offered.

## 2011-10-18 NOTE — H&P (Signed)
PCP: [name] NP (switching)  HPI:  46 y/o woman with h/o headaches, HTN and HL. No known CAD.  Was at friend's house at 10:30p developed CP. Thought it was her gallbladder but CP persisted. Called EMS. ECG with prominent ST elevation antero-laterally with some ST elevation inferiorly. Treated with asa 325 x 4, NTG and heparin. On arrival to ER CP 1/10. Now pain free.   Denies tobacco use or drugs. Still menstruating.   Review of Systems:     Cardiac Review of Systems: {Y] = yes [ ]  = no  Chest Pain [  y  ]  Resting SOB [   ] Exertional SOB  [  ]  Orthopnea [  ]   Pedal Edema [   ]    Palpitations [  ] Syncope  [  ]   Presyncope [   ]  General Review of Systems: [Y] = yes [  ]=no Constitional: recent weight change [  ]; anorexia [  ]; fatigue [  ]; nausea [  ]; night sweats [  ]; fever [  ]; or chills [  ];                                                                                                                                          Dental: poor dentition[  ];   Eye : blurred vision [  ]; diplopia [   ]; vision changes [  ];  Amaurosis fugax[  ]; Resp: cough [  ];  wheezing[  ];  hemoptysis[  ]; shortness of breath[  ]; paroxysmal nocturnal dyspnea[  ]; dyspnea on exertion[  ]; or orthopnea[  ];  GI:  gallstones[  ], vomiting[  ];  dysphagia[  ]; melena[  ];  hematochezia [  ]; heartburn[  ];   Hx of  Colonoscopy[  ]; GU: kidney stones [  ]; hematuria[  ];   dysuria [  ];  nocturia[  ];  history of     obstruction [  ];                 Skin: rash, swelling[  ];, hair loss[  ];  peripheral edema[  ];  or itching[  ]; Musculosketetal: myalgias[  ];  joint swelling[  ];  joint erythema[  ];  joint pain[  ];  back pain[  ];  Heme/Lymph: bruising[  ];  bleeding[  ];  anemia[  ];  Neuro: TIA[  ];  headaches[ y ];  stroke[  ];  vertigo[  ];  seizures[  ];   paresthesias[  ];  difficulty walking[  ];  Psych:depression[  ]; anxiety[  ];  Endocrine: diabetes[  ];  thyroid  dysfunction[  ];    Past Medical History  Diagnosis Date  . Cluster headaches     sporadic  . Hypertension   .  Depression   . Anxiety   . High cholesterol     Medications Prior to Admission  Medication Dose Route Frequency Provider Last Rate Last Dose  . heparin 5000 UNIT/ML injection        5,000 Units at 10/18/11 0253  . metoprolol (LOPRESSOR) 1 MG/ML injection           . nitroGLYCERIN 0.2 mg/mL in dextrose 5 % infusion            Medications Prior to Admission  Medication Sig Dispense Refill  . citalopram (CELEXA) 20 MG tablet Take 20 mg by mouth daily.      . Metoprolol Succinate (TOPROL XL PO) Take 50 mg by mouth daily.       . niacin-simvastatin (SIMCOR) 1000-20 MG 24 hr tablet Take 1 tablet by mouth at bedtime.      Marland Kitchen zolpidem (AMBIEN) 10 MG tablet       . aspirin 325 MG tablet Take 325 mg by mouth daily.      . BuPROPion HCl (WELLBUTRIN PO) Take by mouth.      . Calcium Carbonate-Vit D-Min (CALCIUM 1200 PO) Take by mouth.      . Escitalopram Oxalate (LEXAPRO PO) Take by mouth.      . lamoTRIgine (LAMICTAL) 25 MG tablet       . LORazepam (ATIVAN) 1 MG tablet Take 1 mg by mouth 2 (two) times daily.         Allergies  Allergen Reactions  . Penicillins Hives    History   Social History  . Marital Status: Single    Spouse Name: N/A    Number of Children: 1  . Years of Education: N/A   Occupational History  . Desk job [employer]   Social History Main Topics  . Smoking status: Never Smoker   . Smokeless tobacco: Never Used  . Alcohol Use: Yes     SOCIAL  . Drug Use: No  . Sexually Active: No   Other Topics Concern  . Not on file   Social History Narrative  . No narrative on file    Family History  Problem Relation Age of Onset  . Osteoporosis Mother   . Breast cancer Maternal Aunt   . Diabetes Maternal Grandmother   . Hyperlipidemia Maternal Grandmother   . Cancer Maternal Grandmother     colon  . Heart disease Maternal  Grandmother     great grandmother  . Cancer Paternal Grandfather     lung  Mother: smoker. 2 small MIs  PHYSICAL EXAM: Filed Vitals:   10/18/11 0256  BP: 162/94  Temp: 97.8 F (36.6 C)  Resp: 16   General:  Well appearing. Anxious No respiratory difficulty HEENT: normal Neck: supple. no JVD. Carotids 2+ bilat; no bruits. No lymphadenopathy or thryomegaly appreciated. Cor: PMI nondisplaced. Regular rate & rhythm. No rubs, gallops or murmurs. Lungs: clear Abdomen: soft, nontender, nondistended. No hepatosplenomegaly. No bruits or masses. Good bowel sounds. Extremities: no cyanosis, clubbing, rash, edema Neuro: alert & oriented x 3, cranial nerves grossly intact. moves all 4 extremities w/o difficulty. Affect pleasant.  ECG: SR prominent anterolateral ST elevation  Results for orders placed during the hospital encounter of 10/18/11 (from the past 24 hour(s))  POCT I-STAT, CHEM 8     Status: Abnormal   Collection Time   10/18/11  3:02 AM      Component Value Range   Sodium 139  135 - 145 (mEq/L)   Potassium 3.7  3.5 - 5.1 (mEq/L)   Chloride 107  96 - 112 (mEq/L)   BUN 5 (*) 6 - 23 (mg/dL)   Creatinine, Ser 4.09  0.50 - 1.10 (mg/dL)   Glucose, Bld 811 (*) 70 - 99 (mg/dL)   Calcium, Ion 9.14 (*) 1.12 - 1.32 (mmol/L)   TCO2 19  0 - 100 (mmol/L)   Hemoglobin 13.9  12.0 - 15.0 (g/dL)   HCT 78.2  95.6 - 21.3 (%)   No results found.   ASSESSMENT:  1. Anterolateral ST elevation  2. HTN 3. HL  PLAN/DISCUSSION:  Presentation concerning for spontaneous coronary dissection vs thrombosis. Heparin and NTG started. Code STEMI in progress. Now in cath lab urgently for angiography with Dr. Excell Seltzer.   Yael Coppess,MD 3:13 AM

## 2011-10-18 NOTE — Op Note (Signed)
Cardiac Catheterization Procedure Note  Name: Teresa Wolf MRN: [id number] DOB: [birth year]  Procedure: Left Heart Cath, Selective Coronary Angiography, LV angiography,  PTCA/Stent of the mid LAD  Indication: Anterior wall ST elevation MI   Diagnostic Procedure Details: The right groin was prepped, draped, and anesthetized with 1% lidocaine. Using the modified Seldinger technique, a 6 French sheath was introduced into the right femoral artery. Standard Judkins catheters were used for selective coronary angiography and left ventriculography. Catheter exchanges were performed over a wire.  The diagnostic procedure was well-tolerated without immediate complications.  PROCEDURAL FINDINGS Hemodynamics: AO 163/106 LV 161/27  Coronary angiography: Coronary dominance: right  Left mainstem: Widely patent with an acentric orifice, but the diarrhea flux and no pressure damping. I don't think there is any significant left main stenosis.  Left anterior descending (LAD): The LAD is widely patent in the proximal aspect. Just after the first diagonal branch, the vessel is totally occluded with appearance of thrombus. There is TIMI 0 flow.  Left circumflex (LCx): The left circumflex is widely patent. There is no significant obstructive disease. The vessel is large and smooth throughout its course. There are 2 large obtuse marginal branches with no significant stenosis.  Right coronary artery (RCA): The RCA is dominant. The vessel is smooth throughout its course. There is mild catheter induced vasospasm. The PDA is widely patent. There is no obstructive disease throughout the RCA distribution.  Left ventriculography: Left ventricular systolic function is severely reduced. There is a large area of akinesis from the mid anterior wall extending into the apex and distal inferior wall. The left ventricular ejection fraction is estimated at 35%. There is no significant mitral regurgitation.  PCI Procedure  Note:  Following the diagnostic procedure, the decision was made to proceed with PCI. The patient was loaded with 600 mg of Plavix. Weight-based bivalirudin was given for anticoagulation. Once a therapeutic ACT was achieved, a 6 [place] XB LAD 3.5 guide catheter was inserted.  A cougar coronary guidewire was initially attempted I was not able to pass a cougar wire into the LAD because of acute angulation from the left mainstem. I switched out to a whisper wire and continued to have difficulty to access the LAD. After putting 2 bands on the wire I was finally able to get the whisper wire into the LAD. At that point angiography demonstrated reperfusion of the LAD with patency to the apex. There was severe residual stenosis noted. The whisper wire was advanced across the lesion and the lesion was predilated with a 2.0 x 20 mm balloon.  The lesion was then stented with a 2.25 x 24 mm Promus Alleman drug-eluting stent.  The stent was postdilated with a 2.5 x 20 mm noncompliant balloon.  Following PCI, there was 0% residual stenosis and TIMI-3 flow. Final angiography confirmed an excellent result. Femoral hemostasis was achieved with a Perclose device.  The patient tolerated the PCI procedure well. There were no immediate procedural complications.  The patient was transferred to the post catheterization recovery area for further monitoring.  PCI Data: Vessel - LAD/Segment - mid Percent Stenosis (pre)  100 TIMI-flow 0 Stent 2.25 x 24 mm Promus Alleman Percent Stenosis (post) 0 TIMI-flow (post) 3  Final Conclusions:   #1. 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 Wide patency of the right coronary artery and left circumflex vessels #3. Severe segmental left ventricular systolic dysfunction  Recommendations: The patient will receive aggressive  post MI medical therapy. Will repeat an echo prior to discharge and hopefully be LV EF  will be improved as the patient presented within 6 hours of pain onset and had good ST segment resolution.  She will require dual antiplatelet therapy with aspirin and Plavix for at least 12 months.  Tonny Bollman 10/18/2011, 4:18 AM

## 2011-10-18 NOTE — Progress Notes (Signed)
UR Completed. Simmons, Kyarah Enamorado F [phone]  

## 2011-10-19 DIAGNOSIS — I219 Acute myocardial infarction, unspecified: Secondary | ICD-10-CM

## 2011-10-19 LAB — CBC
HCT: 42.8 % (ref 36.0–46.0)
MCV: 85.6 fL (ref 78.0–100.0)
RDW: 13.1 % (ref 11.5–15.5)
WBC: 19.1 10*3/uL — ABNORMAL HIGH (ref 4.0–10.5)

## 2011-10-19 LAB — COMPREHENSIVE METABOLIC PANEL
Albumin: 3.4 g/dL — ABNORMAL LOW (ref 3.5–5.2)
BUN: 6 mg/dL (ref 6–23)
CO2: 23 mEq/L (ref 19–32)
Chloride: 103 mEq/L (ref 96–112)
Creatinine, Ser: 0.68 mg/dL (ref 0.50–1.10)
GFR calc Af Amer: >90 mL/min (ref >90)
GFR calc non Af Amer: >90 mL/min (ref >90)
Glucose, Bld: 105 mg/dL — ABNORMAL HIGH (ref 70–99)
Total Bilirubin: 0.5 mg/dL (ref 0.3–1.2)

## 2011-10-19 MED ORDER — LEVOFLOXACIN 750 MG PO TABS
750.0000 mg | ORAL_TABLET | Freq: Every day | ORAL | Status: DC
  Administered 2011-10-19 – 2011-10-20 (×2): 750 mg via ORAL
  Filled 2011-10-19 (×2): qty 1

## 2011-10-19 MED ORDER — LISINOPRIL 5 MG PO TABS
5.0000 mg | ORAL_TABLET | Freq: Every day | ORAL | Status: DC
  Administered 2011-10-19 – 2011-10-20 (×2): 5 mg via ORAL
  Filled 2011-10-19 (×2): qty 1

## 2011-10-19 MED FILL — Dextrose Inj 5%: INTRAVENOUS | Qty: 50 | Status: AC

## 2011-10-19 NOTE — Progress Notes (Signed)
CARDIAC REHAB PHASE I   PRE:  Rate/Rhythm: 98 SR    BP: sitting 114/66    SaO2: 96 Ra RA  MODE:  Ambulation: 350 ft   POST:  Rate/Rhythm: 117 ST    BP: sitting 109/73     SaO2: 97 RA Tolerated well. Ed completed. Requests her name be sent to [place]. [id number]  Teresa Wolf CES, ACSM

## 2011-10-19 NOTE — Progress Notes (Signed)
PCP: [name] NP (switching)  HPI:  46 y/o woman with h/o headaches, HTN and HL. No known CAD.  Was at friend's house at 10:30p developed CP. Thought it was her gallbladder but CP persisted. Called EMS. ECG with prominent ST elevation antero-laterally with some ST elevation inferiorly. Treated with asa 325 x 4, NTG and heparin. On arrival to ER CP 1/10. Now pain free.   CATH: 2/13. Left mainstem: Widely patent with an acentric orifice, but the diarrhea flux and no pressure damping.  Left anterior descending (LAD): The LAD is widely patent in the proximal aspect. Just after the first diagonal branch, the vessel is totally occluded with appearance of thrombus. There is TIMI 0 flow.  Left circumflex (LCx): The left circumflex is widely patent. There is no significant obstructive disease. The vessel is large and smooth throughout its course. There are 2 large obtuse marginal branches with no significant stenosis.  Right coronary artery (RCA): The RCA is dominant. The vessel is smooth throughout its course. There is mild catheter induced vasospasm. The PDA is widely patent. There is no obstructive disease throughout the RCA distribution.  Left ventriculography: Left ventricular systolic function is severely reduced. There is a large area of akinesis from the mid anterior wall extending into the apex and distal inferior wall. The left ventricular ejection fraction is estimated at 35%. There is no significant mitral regurgitation.  S/p mid LAD stenting with DES.  Patient feels much better. No new SVT episodes overnight. No chest pain, short of breath, nausea or vomiting. Has intermittent cough but no sputum production. Urine output 2000 cc.   Past Medical History  Diagnosis Date  . Cluster headaches     sporadic  . Hypertension   . Depression   . Anxiety   . High cholesterol     Medications Prior to Admission  Medication Dose Route Frequency Provider Last Rate Last Dose  . 0.9  %  sodium chloride infusion  1 mL/kg/hr Intravenous Continuous Micheline Chapman, MD 73.6 mL/hr at 10/18/11 0445 1 mL/kg/hr at 10/18/11 0445  . 0.9 %  sodium chloride infusion  250 mL Intravenous Continuous Micheline Chapman, MD 10 mL/hr at 10/18/11 1300 250 mL at 10/18/11 1300  . acetaminophen (TYLENOL) tablet 650 mg  650 mg Oral Q4H PRN Dolores Patty, MD      . acetaminophen (TYLENOL) tablet 650 mg  650 mg Oral Q4H PRN Micheline Chapman, MD      . adenosine (ADENOCARD) 12 MG/4ML injection 12 mg  12 mg Intravenous Once Carrolyn Meiers, MD   12 mg at 10/18/11 1111  . adenosine (ADENOCARD) 6 MG/2ML injection 6 mg  6 mg Intravenous Once Carrolyn Meiers, MD   6 mg at 10/18/11 1106  . adenosine (ADENOCARD) 6 MG/2ML injection           . adenosine (ADENOCARD) 6 MG/2ML injection           . ALPRAZolam Prudy Feeler) tablet 0.25 mg  0.25 mg Oral BID PRN Micheline Chapman, MD   0.25 mg at 10/18/11 0804  . aspirin chewable tablet 324 mg  324 mg Oral NOW Dolores Patty, MD   324 mg at 10/18/11 9604   Or  . aspirin suppository 300 mg  300 mg Rectal NOW Dolores Patty, MD      . aspirin chewable tablet 81 mg  81 mg Oral Daily Lyn Hollingshead, MD      . bivalirudin [facility])  250 MG injection           . citalopram (CELEXA) tablet 20 mg  20 mg Oral Daily Dolores Patty, MD   20 mg at 10/18/11 1610  . clopidogrel (PLAVIX) 300 MG tablet           . clopidogrel (PLAVIX) tablet 75 mg  75 mg Oral Q breakfast Micheline Chapman, MD   75 mg at 10/18/11 0804  . fentaNYL (SUBLIMAZE) 0.05 MG/ML injection           . heparin 2-0.9 UNIT/ML-% infusion           . heparin 5000 UNIT/ML injection        5,000 Units at 10/18/11 0253  . heparin injection 5,000 Units  5,000 Units Subcutaneous Q8H Dolores Patty, MD   5,000 Units at 10/19/11 9604  . labetalol (NORMODYNE,TRANDATE) 5 MG/ML injection           . lidocaine (XYLOCAINE) 1 % injection           . LORazepam (ATIVAN) tablet 1 mg  1 mg Oral BID Dolores Patty, MD   1 mg  at 10/18/11 2158  . metoprolol (LOPRESSOR) injection 5 mg  5 mg Intravenous Once Lyn Hollingshead, MD   5 mg at 10/18/11 0258  . metoprolol (LOPRESSOR) tablet 50 mg  50 mg Oral BID Lyn Hollingshead, MD   50 mg at 10/18/11 2158  . midazolam (VERSED) 2 MG/2ML injection           . midazolam (VERSED) 2 MG/2ML injection           . nitroGLYCERIN (NITROSTAT) SL tablet 0.4 mg  0.4 mg Sublingual Q5 Min x 3 PRN Dolores Patty, MD      . nitroGLYCERIN (NTG ON-CALL) 0.2 mg/mL injection           . nitroGLYCERIN 0.2 mg/mL in dextrose 5 % infusion           . ondansetron (ZOFRAN) 4 MG/2ML injection        4 mg at 10/18/11 0230  . ondansetron (ZOFRAN) injection 4 mg  4 mg Intravenous Q6H PRN Dolores Patty, MD      . ondansetron [facility]) injection 4 mg  4 mg Intravenous Q6H PRN Micheline Chapman, MD      . rosuvastatin (CRESTOR) tablet 40 mg  40 mg Oral Daily Dolores Patty, MD   40 mg at 10/18/11 5409  . sodium chloride 0.9 % injection 3 mL  3 mL Intravenous Q12H Micheline Chapman, MD   3 mL at 10/18/11 2158  . sodium chloride 0.9 % injection 3 mL  3 mL Intravenous PRN Micheline Chapman, MD      . zolpidem [facility]) tablet 10 mg  10 mg Oral QHS PRN Micheline Chapman, MD   10 mg at 10/18/11 2159  . DISCONTD: aspirin 81 MG chewable tablet           . DISCONTD: aspirin EC tablet 325 mg  325 mg Oral Daily Micheline Chapman, MD   325 mg at 10/18/11 8119  . DISCONTD: aspirin EC tablet 81 mg  81 mg Oral Daily Dolores Patty, MD      . DISCONTD: aspirin tablet 325 mg  325 mg Oral Daily Dolores Patty, MD      . DISCONTD: metoprolol succinate (TOPROL-XL) 24 hr tablet 50 mg  50 mg Oral Daily Dolores Patty, MD   50 mg  at 10/18/11 0927  . DISCONTD: metoprolol succinate (TOPROL-XL) 24 hr tablet 50 mg  50 mg Oral Daily Dolores Patty, MD       Medications Prior to Admission  Medication Sig Dispense Refill  . aspirin 325 MG tablet Take 325 mg by mouth daily.      . Calcium Carbonate-Vit D-Min (CALCIUM 1200  PO) Take by mouth.      . citalopram (CELEXA) 20 MG tablet Take 20 mg by mouth daily.      Marland Kitchen lamoTRIgine (LAMICTAL) 25 MG tablet       . LORazepam (ATIVAN) 1 MG tablet Take 1 mg by mouth 2 (two) times daily.      . Metoprolol Succinate (TOPROL XL PO) Take 50 mg by mouth daily.       . niacin-simvastatin (SIMCOR) 1000-20 MG 24 hr tablet Take 1 tablet by mouth at bedtime.      Marland Kitchen zolpidem (AMBIEN) 10 MG tablet           PHYSICAL EXAM: Filed Vitals:   10/19/11 0700  BP: 129/86  Pulse: 81  Temp:   Resp: 20   General:  Well appearing. Anxious. HEENT: normal Neck: supple. no JVD. Carotids 2+ bilat; no bruits. No lymphadenopathy or thryomegaly appreciated. Cor: PMI nondisplaced. RRR. No rubs, gallops or murmurs. Lungs: CTAB, no wheezes or rales. Abdomen: soft, nontender, nondistended. No hepatosplenomegaly. No bruits or masses. Good bowel sounds. Extremities: no cyanosis, clubbing, rash, edema Neuro: alert & oriented x 3, cranial nerves grossly intact. moves all 4 extremities w/o difficulty. Affect pleasant.  ECG: on admission: SR prominent anterolateral ST elevation 10:00 am 2/13: SVT with no ST elevation.  Results for orders placed during the hospital encounter of 10/18/11 (from the past 24 hour(s))  GLUCOSE, CAPILLARY     Status: Abnormal   Collection Time   10/18/11  7:45 PM      Component Value Range   Glucose-Capillary 187 (*) 70 - 99 (mg/dL)  CBC     Status: Abnormal   Collection Time   10/19/11  5:25 AM      Component Value Range   WBC 19.1 (*) 4.0 - 10.5 (K/uL)   RBC 5.00  3.87 - 5.11 (MIL/uL)   Hemoglobin 14.2  12.0 - 15.0 (g/dL)   HCT 19.1  47.8 - 29.5 (%)   MCV 85.6  78.0 - 100.0 (fL)   MCH 28.4  26.0 - 34.0 (pg)   MCHC 33.2  30.0 - 36.0 (g/dL)   RDW 62.1  30.8 - 65.7 (%)   Platelets 282  150 - 400 (K/uL)  COMPREHENSIVE METABOLIC PANEL     Status: Abnormal   Collection Time   10/19/11  5:25 AM      Component Value Range   Sodium 136  135 - 145 (mEq/L)    Potassium 3.8  3.5 - 5.1 (mEq/L)   Chloride 103  96 - 112 (mEq/L)   CO2 23  19 - 32 (mEq/L)   Glucose, Bld 105 (*) 70 - 99 (mg/dL)   BUN 6  6 - 23 (mg/dL)   Creatinine, Ser 8.46  0.50 - 1.10 (mg/dL)   Calcium 9.2  8.4 - 96.2 (mg/dL)   Total Protein 6.6  6.0 - 8.3 (g/dL)   Albumin 3.4 (*) 3.5 - 5.2 (g/dL)   AST 94 (*) 0 - 37 (U/L)   ALT 26  0 - 35 (U/L)   Alkaline Phosphatase 71  39 - 117 (U/L)   Total Bilirubin 0.5  0.3 - 1.2 (mg/dL)   GFR calc non Af Amer >90  >90 (mL/min)   GFR calc Af Amer >90  >90 (mL/min)   Dg Chest 2 View  10/18/2011  *RADIOLOGY REPORT*  Clinical Data: ST elevation MI.  Leukocytosis.  CHEST - 2 VIEW 10/18/2011:  Comparison: None.  Findings: Cardiac silhouette upper normal in size.  Hilar and mediastinal contours unremarkable.  Patchy airspace opacities in the right lower lobe.  Lungs otherwise clear.  No pleural effusions.  Visualized bony thorax intact.  IMPRESSION: Right lower lobe pneumonia.  Original Report Authenticated By: Arnell Sieving, M.D.   2D ECHO: 2/13: - Left ventricle: There was mild focal basal hypertrophy of the septum. EF- 60% to 65%. Severe hypokinesis of the mid-distalanteroseptal myocardium. Features are consistent with a pseudonormal left ventricular filling pattern, with concomitant abnormal relaxation and increased filling pressure (grade 2 diastolic dysfunction). - Mitral valve: Mild to moderate regurgitation. - Atrial septum: No defect or patent foramen ovale was identified.     ASSESSMENT and PLAN:   1. Anterolateral ST elevation : LAD Stent DES 2/13 as above. Single vessel disease.  Troponin  >25. No prior Hx of CAD. Hx of HTN and HLD.  Plan: - Dual Antiplatelet RX for 1 yr-  ASA, Plavix. - Cont Statin. - increase BB toprol 50 bid. - Transfer to telemetry bed today.  2.SVT: Adenosine 6 mg and then 12mg  IV push given- 2/13 HR in 90's to low 100's since then. -  Increased dose of Metoprolol to 50 BID.  3. HTN: Cont  BB - Will consider adding ACE once BP tolerates.  4. HLD: Cont statin  5. Right lower lobe pneumonia: Cough without sputum production, WBC 19,000- down from 22,000. - Will consider adding antibiotics today- cover community-acquired PNA.  5: DVT prophylaxis: Heparin sq   PATEL,RAVI,MD 8:07 AM  Patient seen with resident, agree with note.  Patient doing well post-MI.  No further SVT.  She does have a RLL infiltrate on CXR.  - levofloxacin 750 mg daily - Add ACEI - Transfer to floor, probably home in am.   Marca Ancona 10/19/2011 9:20 AM

## 2011-10-20 ENCOUNTER — Telehealth: Payer: Self-pay | Admitting: Cardiovascular Disease

## 2011-10-20 DIAGNOSIS — I213 ST elevation (STEMI) myocardial infarction of unspecified site: Secondary | ICD-10-CM

## 2011-10-20 DIAGNOSIS — I219 Acute myocardial infarction, unspecified: Secondary | ICD-10-CM

## 2011-10-20 MED ORDER — NITROGLYCERIN 0.4 MG SL SUBL: 0.4000 mg | SUBLINGUAL_TABLET | SUBLINGUAL | Status: DC | PRN

## 2011-10-20 MED ORDER — ATORVASTATIN CALCIUM 40 MG PO TABS: 40.0000 mg | ORAL_TABLET | Freq: Every day | ORAL | Status: DC

## 2011-10-20 MED ORDER — ASPIRIN 81 MG PO CHEW: 81.0000 mg | CHEWABLE_TABLET | Freq: Every day | ORAL | Status: DC

## 2011-10-20 MED ORDER — CLOPIDOGREL BISULFATE 75 MG PO TABS: 75.0000 mg | ORAL_TABLET | Freq: Every day | ORAL | Status: DC

## 2011-10-20 MED ORDER — NIACIN ER (ANTIHYPERLIPIDEMIC) 1000 MG PO TBCR: 1000.0000 mg | EXTENDED_RELEASE_TABLET | Freq: Every day | ORAL | Status: DC

## 2011-10-20 MED ORDER — LEVOFLOXACIN 750 MG PO TABS: 750.0000 mg | ORAL_TABLET | Freq: Every day | ORAL | Status: AC

## 2011-10-20 MED ORDER — LISINOPRIL 5 MG PO TABS: 5.0000 mg | ORAL_TABLET | Freq: Every day | ORAL | Status: DC

## 2011-10-20 MED ORDER — METOPROLOL TARTRATE 50 MG PO TABS: 50.0000 mg | ORAL_TABLET | Freq: Two times a day (BID) | ORAL | Status: DC

## 2011-10-20 NOTE — Progress Notes (Signed)
    Subjective:  No chest pain or dyspnea. Felt a little weak on her feet today. No palps or other complaints.  Objective:  Vital Signs in the last 24 hours: Temp:  [98 F (36.7 C)-98.7 F (37.1 C)] 98.7 F (37.1 C) (02/15 0621) Pulse Rate:  [97-112] 112  (02/15 0940) Resp:  [15-20] 19  (02/15 0621) BP: (107-122)/(77-90) 118/90 mmHg (02/15 0940) SpO2:  [95 %-99 %] 99 % (02/15 0621) Weight:  [74.3 kg (163 lb 12.8 oz)-75 kg (165 lb 5.5 oz)] 74.3 kg (163 lb 12.8 oz) (02/15 0621)  Intake/Output from previous day: 02/14 0701 - 02/15 0700 In: 603 [P.O.:600; I.V.:3] Out: 925 [Urine:925]  Physical Exam: Pt is alert and oriented, NAD HEENT: normal Neck: JVP - normal, carotids 2+= without bruits Lungs: CTA bilaterally CV: RRR without murmur or gallop Abd: soft, NT, Positive BS, no hepatomegaly Ext: no C/C/E, distal pulses intact and equal. Right groin clear Skin: warm/dry no rash   Lab Results:  Basename 10/19/11 0525 10/18/11 0500  WBC 19.1* 21.6*  HGB 14.2 12.8  PLT 282 305    Basename 10/19/11 0525 10/18/11 0500  NA 136 133*  K 3.8 3.7  CL 103 101  CO2 23 21  GLUCOSE 105* 130*  BUN 6 6  CREATININE 0.68 0.62    Basename 10/18/11 0547  TROPONINI >25.00*   Tele: sinus rhythm, sinus tach  Assessment/Plan:  1. Anterolateral MI - s/p primary PCI. Pt has had good recovery of LV function with LVEF 60% by echo. Continue ASA and plavix for at least 12 months. Secondary risk reduction measures as below. 2. Hyperlipidemia - discharge on statin - change to lipitor 40 mg 3. HTN -  Continue lisinopril and metoprolol 4. Dispo - home today. Cardiac rehab referral. Follow-up Tereso Newcomer within 2 weeks. OK to rtn to work after she sees Acupuncturist.  Tonny Bollman, M.D. 10/20/2011, 10:33 AM

## 2011-10-20 NOTE — Telephone Encounter (Signed)
Nurse from [id number] called and I made her aware that she needs to contact our hospital team in regards to the pt in the hospital.  I will contact cardmaster at this time since this is a new nurse and she did not know the protocol.

## 2011-10-20 NOTE — Discharge Summary (Signed)
Discharge Summary   Patient ID: Teresa Wolf,  MRN: [id number], DOB/AGE: 09-27-1965 46 y.o.  Admit date: 10/18/2011 Discharge date: 10/20/2011  Discharge Diagnoses Principal Problem:  *ST elevation (STEMI) myocardial infarction Active Problems:  Hypertension  High cholesterol  SVT (supraventricular tachycardia)   Allergies Allergies  Allergen Reactions  . Penicillins Hives   Procedures  Cardiac catheterization  Hemodynamics:  AO 163/106  LV 161/27  Coronary angiography:  Coronary dominance: right  Left mainstem: Widely patent with an acentric orifice, but the diarrhea flux and no pressure damping. I don't think there is any significant left main stenosis.  Left anterior descending (LAD): The LAD is widely patent in the proximal aspect. Just after the first diagonal branch, the vessel is totally occluded with appearance of thrombus. There is TIMI 0 flow.  Left circumflex (LCx): The left circumflex is widely patent. There is no significant obstructive disease. The vessel is large and smooth throughout its course. There are 2 large obtuse marginal branches with no significant stenosis.  Right coronary artery (RCA): The RCA is dominant. The vessel is smooth throughout its course. There is mild catheter induced vasospasm. The PDA is widely patent. There is no obstructive disease throughout the RCA distribution.  Left ventriculography: Left ventricular systolic function is severely reduced. There is a large area of akinesis from the mid anterior wall extending into the apex and distal inferior wall. The left ventricular ejection fraction is estimated at 35%. There is no significant mitral regurgitation.  PCI Data:  Vessel - LAD/Segment - mid  Percent Stenosis (pre) 100  TIMI-flow 0  Stent 2.25 x 24 mm Promus Alleman  Percent Stenosis (post) 0  TIMI-flow (post) 3  Final Conclusions:  #1. Acute anterior wall MI secondary mid LAD occlusion, treated successfully with primary PCI  utilizing a drug-eluting stent  #2. Wide patency of the right coronary artery and left circumflex vessels  #3. Severe segmental left ventricular systolic dysfunction  Recommendations: The patient will receive aggressive post MI medical therapy. Will repeat an echo prior to discharge and hopefully be LV EF will be improved as the patient presented within 6 hours of pain onset and had good ST segment resolution. She will require dual antiplatelet therapy with aspirin and Plavix for at least 12 months.  2D echocardiogram with contrast  Study Conclusions  - Left ventricle: The cavity size was normal. There was mild focal basal hypertrophy of the septum. Systolic function was normal. The estimated ejection fraction was in the range of 60% to 65%. There is severe hypokinesis of the mid-distalanteroseptal myocardium. Features are consistent with a pseudonormal left ventricular filling pattern, with concomitant abnormal relaxation and increased filling pressure (grade 2 diastolic dysfunction). - Mitral valve: Mild to moderate regurgitation. - Atrial septum: No defect or patent foramen ovale was identified. Transthoracic echocardiography. M-mode, complete 2D, spectral Doppler, and color Doppler. Height: Height: 154.9cm. Height: 61in. Weight: Weight: 73kg. Weight: 160.6lb. Body mass index: BMI: 30.4kg/m^2. Body surface area: BSA: 1.28m^2. Blood pressure: 128/82. Patient status: Inpatient. Location: Bedside. ------------------------------------------------------------ ------------------------------------------------------------ Left ventricle: The cavity size was normal. There was mild focal basal hypertrophy of the septum. Systolic function was normal. The estimated ejection fraction was in the range of 60% to 65%. Regional wall motion abnormalities: There is severe hypokinesis of the mid-distalanteroseptal myocardium. Features are consistent with a pseudonormal left ventricular filling pattern, with  concomitant abnormal relaxation and increased filling pressure (grade 2 diastolic dysfunction). ------------------------------------------------------------ Aortic valve: Structurally normal valve. Cusp separation was normal. Doppler: Transvalvular  velocity was within the normal range. There was no stenosis. No regurgitation. ------------------------------------------------------------ Aorta: Aortic root: The aortic root was normal in size. Ascending aorta: The ascending aorta was normal in size. ------------------------------------------------------------ Mitral valve: Mildly thickened leaflets . Leaflet separation was normal. Doppler: Transvalvular velocity was within the normal range. There was no evidence for stenosis. Mild to moderate regurgitation. Peak gradient: 6mm Hg (D). ------------------------------------------------------------ Left atrium: The atrium was normal in size. ------------------------------------------------------------ Atrial septum: No defect or patent foramen ovale was identified. ------------------------------------------------------------ Right ventricle: The cavity size was normal. Systolic function was normal. ------------------------------------------------------------ Pulmonic valve: Structurally normal valve. Cusp separation was normal. Doppler: Transvalvular velocity was within the normal range. No regurgitation. ------------------------------------------------------------ Tricuspid valve: Doppler: Trivial regurgitation. ------------------------------------------------------------ Right atrium: The atrium was normal in size. ------------------------------------------------------------ Pericardium: There was no pericardial effusion.  History of Present Illness  Ms. Teresa Wolf is a 46 yo female without a significant cardiac history and PMHx significant for HTN and HL who was admitted to [facility] with chest pain.   A limited history was acquired to acuity of  situation. She was visiting a friend and developed chest pain at 10:30 PM on 10/17/11. She related the discomfort to her gallbladder, however the pain persisted and she called EMS. Upon EMS arrival, ECG revealed prominent ST elevation anterolaterally with some ST elevation inferiorly. She was treated with ASA 325 x 4, NTG and heparin. On arrival to [facility] ED, pain had decreased to 1/10. Code STEMI was called and she was transported immediately to the cath lab.   Hospital Course   The procedure elicited the above details including 100% mid LAD lesion s/p successful DES placement with TIMI 3 flow, wide patency of RCA and severe segmental LV systolic dysfunction. The recommendation was made for DAPT- ASA/Plavix x 1 year, aggressive medical therapy and risk factor reduction. She tolerated the procedure well without immediate complications. She was subsequently transferred to stepdown. She was started on BB and statin.  She developed SVT the following morning at 220 bpm with conversion to NSR with rates at 90-100 bpm s/p adenosine. Lopressor was increased. She was noted to have a leukocytosis of 22,000 and CXR was ordered revealing RLL pneumonia. She was started on Levaquin to cover for CAP. A 2D echo was performed revealed the above details including mild focal basal septal hypertrophy, LVEF 06-65%, severe hypokinesis of the mid-distalanteroseptal myocardium and features consistent with grade 2 diastolic dysfunctinon, mild-mod MR. She remained in the stepdown unit to monitor for any further arrhythmias.   She remained asymptomatic the following day complaining of a mild nonproductive cough. Low-dose ACEi was added and she was transferred to the telemetry floor. Her WBC count trended down, but remained elevated. She remained asymptomatic. She ambulated well without incident.   Today, she will be discharged home on ASA/Plavix x 1 year, Lisinopril, Lopressor, statin, NTG SL PRN, Levaguin x 7 days. She will follow-up  with Tereso Newcomer, PA-C in 2 weeks. An order has been placed for cardiac rehab. She was given a return to work note after her follow-up appointment.   Discharge Vitals:  Blood pressure 118/90, pulse 112, temperature 98.7 F (37.1 C), temperature source Oral, resp. rate 19, height 5\' 1"  (1.549 m), weight 74.3 kg (163 lb 12.8 oz), last menstrual period 09/17/2011, SpO2 99.00%.   Labs: Recent Labs  Basename 10/19/11 0525 10/18/11 0500   WBC 19.1* 21.6*   HGB 14.2 12.8   HCT 42.8 38.6   MCV 85.6 85.0   PLT 282 305  Lab 10/19/11 0525 10/18/11 0500 10/18/11 0302  NA 136 133* 139  K 3.8 3.7 3.7  CL 103 101 107  CO2 23 21 --  BUN 6 6 5*  CREATININE 0.68 0.62 0.70  CALCIUM 9.2 8.7 --  PROT 6.6 -- --  BILITOT 0.5 -- --  ALKPHOS 71 -- --  ALT 26 -- --  AST 94* -- --  AMYLASE -- -- --  LIPASE -- -- --  GLUCOSE 105* 130* 169*   Recent Labs  Basename 10/18/11 0546   HGBA1C 5.6   Recent Labs  Basename 10/18/11 0547   CKTOTAL 2378*   CKMB 459.8*   CKMBINDEX --   TROPONINI >25.00*    Recent Labs  Basename 10/18/11 0547   CHOL 180   HDL 46   LDLCALC 119*   TRIG 76   CHOLHDL 3.9   LDLDIRECT --    Basename 10/18/11 0546  TSH 1.387  T4TOTAL --  T3FREE --  THYROIDAB --    Disposition:  Discharge Orders    Future Appointments: Provider: Department: Dept Phone: Center:   11/03/2011 9:50 AM Beatrice Lecher, PA [facility] [phone] [facility]     Follow-up Information    Follow up with Tereso Newcomer, PA on 11/03/2011. (At 9:10 AM. )    Contact information:   [address] [place] [address] [phone]         Discharge Medications:  Medication List  As of 10/20/2011  1:53 PM   START taking these medications         * aspirin 81 MG chewable tablet   Chew 1 tablet (81 mg total) by mouth daily.      atorvastatin 40 MG tablet   Commonly known as: LIPITOR   Take 1 tablet (40 mg total) by mouth daily.      clopidogrel  75 MG tablet   Commonly known as: PLAVIX   Take 1 tablet (75 mg total) by mouth daily with breakfast.      levofloxacin 750 MG tablet   Commonly known as: LEVAQUIN   Take 1 tablet (750 mg total) by mouth daily.      lisinopril 5 MG tablet   Commonly known as: PRINIVIL,ZESTRIL   Take 1 tablet (5 mg total) by mouth daily.      metoprolol 50 MG tablet   Commonly known as: LOPRESSOR   Take 1 tablet (50 mg total) by mouth 2 (two) times daily.      niacin 1000 MG CR tablet   Commonly known as: NIASPAN   Take 1 tablet (1,000 mg total) by mouth at bedtime.      * nitroGLYCERIN 0.4 MG SL tablet   Commonly known as: NITROSTAT   Place 1 tablet (0.4 mg total) under the tongue every 5 (five) minutes x 3 doses as needed for chest pain.     * Notice: This list has 2 medication(s) that are the same as other medications prescribed for you. Read the directions carefully, and ask your doctor or other care provider to review them with you.       CONTINUE taking these medications         CALCIUM 1200 PO      citalopram 20 MG tablet   Commonly known as: CELEXA      lamoTRIgine 25 MG tablet   Commonly known as: LAMICTAL      LORazepam 1 MG tablet   Commonly known as: ATIVAN      *  nitroGLYCERIN 0.4 MG SL tablet   Commonly known as: NITROSTAT      zolpidem 10 MG tablet   Commonly known as: AMBIEN     * Notice: This list has 1 medication(s) that are the same as other medications prescribed for you. Read the directions carefully, and ask your doctor or other care provider to review them with you.       STOP taking these medications         LEXAPRO PO      niacin-simvastatin 1000-20 MG 24 hr tablet      TOPROL XL PO      WELLBUTRIN PO      ZOFRAN 4 MG/2ML Soln injection         ASK your doctor about these medications         * aspirin 81 MG chewable tablet     * Notice: This list has 1 medication(s) that are the same as other medications prescribed for you. Read the directions  carefully, and ask your doctor or other care provider to review them with you.        Where to get your medications    These are the prescriptions that you need to pick up. We sent them to a specific pharmacy, so you will need to go there to get them.   [id number] [place], [place] - [address] [facility] [address]    [address] [place] [address]    Phone: [phone]        atorvastatin 40 MG tablet   clopidogrel 75 MG tablet   levofloxacin 750 MG tablet   lisinopril 5 MG tablet   metoprolol 50 MG tablet   niacin 1000 MG CR tablet   nitroGLYCERIN 0.4 MG SL tablet         Information on where to get these meds is not yet available. Ask your nurse or doctor.         aspirin 81 MG chewable tablet           Outstanding Labs/Studies: None  Duration of Discharge Encounter: 40 minutes including physician time.  Signed, R. Hurman Horn, PA-C 10/20/2011, 1:53 PM

## 2011-10-20 NOTE — Telephone Encounter (Signed)
New Msg: Pt needs MD to complete medication reconciliation in order for pt to be d/c. Please return call to discuss further.

## 2011-10-20 NOTE — Progress Notes (Signed)
Pt discharged home with family. Pt A&O, MOE x 4. Discussed discharge instructions with pt including when to return to work, wound care, signs and symptoms of infx, how and when to call the dr, medications to take at home, follow up appts, diet, and activity. Pt denied any questions and verbalized understanding.

## 2011-10-20 NOTE — Discharge Instructions (Signed)
PLEASE REMEMBER TO BRING ALL OF YOUR MEDICATIONS TO EACH OF YOUR FOLLOW-UP OFFICE VISITS.  Activity: Increase activity slowly as tolerated. You may shower, but no soaking baths for 5 days. No driving x 2 days. No lifting over 5 lbs for 5 days. No sexual activity for 5 days.   You May Return to Work: on 11/04/11  Wound Care: You may wash cath site gently with soap and water. Keep cath site clean and dry. If you notice pain, swelling, bleeding or pus at your cath site, please call [phone].

## 2011-10-21 NOTE — Discharge Summary (Signed)
Agree as outlined. See my progress note this same date. 

## 2011-11-03 ENCOUNTER — Telehealth: Payer: Self-pay | Admitting: Physician Assistant

## 2011-11-03 ENCOUNTER — Telehealth: Payer: Self-pay | Admitting: *Deleted

## 2011-11-03 ENCOUNTER — Ambulatory Visit (INDEPENDENT_AMBULATORY_CARE_PROVIDER_SITE_OTHER): Payer: BC Managed Care – PPO | Admitting: Physician Assistant

## 2011-11-03 ENCOUNTER — Encounter: Payer: Self-pay | Admitting: Physician Assistant

## 2011-11-03 VITALS — BP 100/80 | Ht 61.0 in | Wt 164.0 lb

## 2011-11-03 DIAGNOSIS — I498 Other specified cardiac arrhythmias: Secondary | ICD-10-CM

## 2011-11-03 DIAGNOSIS — J189 Pneumonia, unspecified organism: Secondary | ICD-10-CM

## 2011-11-03 DIAGNOSIS — I471 Supraventricular tachycardia: Secondary | ICD-10-CM

## 2011-11-03 DIAGNOSIS — I213 ST elevation (STEMI) myocardial infarction of unspecified site: Secondary | ICD-10-CM

## 2011-11-03 DIAGNOSIS — I2510 Atherosclerotic heart disease of native coronary artery without angina pectoris: Secondary | ICD-10-CM

## 2011-11-03 DIAGNOSIS — I10 Essential (primary) hypertension: Secondary | ICD-10-CM

## 2011-11-03 DIAGNOSIS — I219 Acute myocardial infarction, unspecified: Secondary | ICD-10-CM

## 2011-11-03 DIAGNOSIS — E785 Hyperlipidemia, unspecified: Secondary | ICD-10-CM

## 2011-11-03 LAB — BASIC METABOLIC PANEL
CO2: 28 mEq/L (ref 19–32)
Calcium: 8.8 mg/dL (ref 8.4–10.5)
Glucose, Bld: 98 mg/dL (ref 70–99)
Potassium: 4 mEq/L (ref 3.5–5.1)
Sodium: 137 mEq/L (ref 135–145)

## 2011-11-03 NOTE — Patient Instructions (Addendum)
Your physician recommends that you schedule a follow-up appointment in: 6 WEEKS WITH DR. Excell Seltzer  Your physician recommends that you schedule a follow-up appointment in: TODAY BMET 401.9  Your physician recommends that you return for lab work in: 6 WEEKS 01/24/12 FOR FASTING LIPID/LIVER PANEL 272.4  You have been referred to [facility] OFFICE TO EST WITH A PCP DX HTN, HYPERLIPIDEMIA  Your physician has recommended you make the following change in your medication: OK TO TAKE 1/2 METOPROLOL FOR PALPITATIONS IF OTHER MANEUVERS DO NOT WORK FOR YOU

## 2011-11-03 NOTE — Progress Notes (Signed)
[address] [place], [place]  [address] Phone: [phone] Fax:  [phone]  Date:  11/03/2011   Name:  Teresa Wolf       DOB:  1965/12/15 MRN:  [id number]  PCP:  Previously [name]; she wants to establish with [facility] now Primary Cardiologist:  Dr. Tonny Bollman  Primary Electrophysiologist:  None    History of Present Illness: Teresa Wolf is a 46 y.o. female who presents for post hospital follow up.    She has a history of hypertension and hyperlipidemia.  She was admitted 2/13-2/15 with an acute anterior STEMI.  Emergent LHC 10/18/11: LAD occluded with thrombus, circumflex patent, RCA patent, EF 35% with mid anterior akinesis extending into the apex and distal inferior wall.  PCI: Promus DES to the mid LAD.  Follow up echocardiogram 10/18/11: EF 60-65%, mild focal basal septal hypertrophy, severe hypokinesis of the mid to distal anterior septal myocardium, grade 2 diastolic dysfunction, mild to moderate MR.  Post MI she did develop SVT with heart rates of up to 220 that converted to normal sinus rhythm with adenosine.  Her beta blocker dose was increased.  Chest x-ray demonstrated a right lower lobe pneumonia and she was placed on Levaquin.  Labs: Hemoglobin 14.2, potassium 3.8, creatinine 0.68, AST 94, ALT 26, hemoglobin A1c 5.6, TC 108 he, HDL 46, LDL 119, TG 76, TSH 1.387.  She is doing well.  She's been walking.  She already has cardiac rehabilitation arranged.  She denies chest pain or shortness of breath.  She denies orthopnea, PND or edema.  She denies fevers, chills, cough.  She has finished her antibiotics.  She did have one episode of palpitations that lasted about 30 seconds.  Past Medical History  Diagnosis Date  . Cluster headaches     sporadic  . Hypertension   . Depression   . Anxiety   . HLD (hyperlipidemia)   . CAD in native artery     s/p Ant STEMI 10/18/11;  Emergent LHC 10/18/11: LAD occluded with thrombus, circumflex  patent, RCA patent, EF 35% with mid anterior akinesis extending into the apex and distal inferior wall.  PCI: Promus DES to the mid LAD.  Follow up echocardiogram 10/18/11: EF 60-65%, mild focal basal septal hypertrophy, severe hypokinesis of the mid to distal anterior septal myocardium, grade 2 diastolic dysfuncti  . SVT (supraventricular tachycardia)     post MI 2/13 converted with adenosine    Current Outpatient Prescriptions  Medication Sig Dispense Refill  . aspirin 81 MG chewable tablet Chew 81 mg by mouth once.       Marland Kitchen atorvastatin (LIPITOR) 40 MG tablet Take 1 tablet (40 mg total) by mouth daily.  30 tablet  3  . Calcium Carbonate-Vit D-Min (CALCIUM 1200 PO) Take by mouth.      . citalopram (CELEXA) 20 MG tablet Take 20 mg by mouth daily.      . clopidogrel (PLAVIX) 75 MG tablet Take 1 tablet (75 mg total) by mouth daily with breakfast.  30 tablet  3  . lisinopril (PRINIVIL,ZESTRIL) 5 MG tablet Take 1 tablet (5 mg total) by mouth daily.  30 tablet  3  . LORazepam (ATIVAN) 1 MG tablet Take 1 mg by mouth 2 (two) times daily.      . metoprolol (LOPRESSOR) 50 MG tablet Take 1 tablet (50 mg total) by mouth 2 (two) times daily.  60 tablet  3  . niacin (NIASPAN) 1000 MG CR tablet Take  1 tablet (1,000 mg total) by mouth at bedtime.  30 tablet  3  . nitroGLYCERIN (NITROSTAT) 0.4 MG SL tablet Place 0.4 mg under the tongue every 5 (five) minutes as needed. Chest pain Total of 4 given en route      . zolpidem (AMBIEN) 10 MG tablet As needed        Allergies: Allergies  Allergen Reactions  . Penicillins Hives    History  Substance Use Topics  . Smoking status: Never Smoker   . Smokeless tobacco: Never Used  . Alcohol Use: Yes     SOCIAL     ROS:  Please see the history of present illness.    All other systems reviewed and negative.   PHYSICAL EXAM: VS:  BP 100/80  Ht 5\' 1"  (1.549 m)  Wt 164 lb (74.39 kg)  BMI 30.99 kg/m2  LMP 09/17/2011 Well nourished, well developed, in no acute  distress HEENT: normal Neck: no JVD Cardiac:  normal S1, S2; RRR; no murmur, No rub Lungs:  clear to auscultation bilaterally, no wheezing, rhonchi or rales Abd: soft, nontender, no hepatomegaly Ext: no edema; right groin without hematoma or bruit  Skin: warm and dry Neuro:  CNs 2-12 intact, no focal abnormalities noted  EKG:  Sinus rhythm, heart rate 81, rightward axis, diffuse T wave inversions in leads 2, 3, aVF, V2-V5  ASSESSMENT AND PLAN:  1. Coronary Artery Disease  Doing well post MI.  She has had no further chest pain.  EF was down at Cardiac catheterization.  However, EF was normal a followup echocardiogram.  Continue beta blocker and ACE inhibitor.  She will start cardiac rehabilitation soon.  I will fill out her FMLA forms for her.  She has already been released to go back to work next week.  Followup with Dr. Excell Seltzer in 6 weeks.   2. Anterior STEMI 10/18/2011 treated with Promus DES to the mid LAD  ECG demonstrates evolving anterior MI.  She does not have any symptoms of pericarditis.   3. Hypertension  Controlled.  Check a basic metabolic panel today.   4. Hyperlipidemia  Arrange fasting lipids and LFTs in 6 weeks.   5. SVT (supraventricular tachycardia)  She has had one recurrence.  She had frequent symptoms prior to her myocardial infarction.  Over the years, her palpitations seemed to become more frequent.  I cannot adjust her metoprolol any further.  We discussed vagal maneuvers today.  She can also take an extra half of a metoprolol if vagal maneuvers are unsuccessful.  We discussed when she should go to the emergency room.  If she continues to have frequent symptoms, we can refer her to electrophysiology.   6. Community acquired pneumonia  Resolved.     Luna Glasgow, PA-C  9:31 AM 11/03/2011

## 2011-11-03 NOTE — Telephone Encounter (Signed)
Called Pt let her know FMLA [facility] [employer] [employer]) faxed to to [facility] [employer] @ [phone] 11/03/11/km

## 2011-11-03 NOTE — Telephone Encounter (Signed)
pt notified of lab results, gave verbal understanding today. Danielle Rankin

## 2011-11-03 NOTE — Telephone Encounter (Signed)
Message copied by Tarri Fuller on Fri Nov 03, 2011  3:28 PM ------      Message from: [place], [place] T      Created: Fri Nov 03, 2011  1:21 PM       Please notify patient that the lab results are ok.      Tereso Newcomer, PA-C  1:21 PM 11/03/2011

## 2011-11-06 NOTE — Telephone Encounter (Signed)
FMLA Pick Up By pt 11/06/11/KM

## 2011-11-09 ENCOUNTER — Encounter (HOSPITAL_COMMUNITY)
Admission: RE | Admit: 2011-11-09 | Discharge: 2011-11-09 | Disposition: A | Discharge status: Home / Self Care | Payer: BC Managed Care – PPO | Source: Ambulatory Visit | Attending: Cardiovascular Disease | Admitting: Cardiovascular Disease

## 2011-11-09 ENCOUNTER — Encounter (HOSPITAL_COMMUNITY): Payer: Self-pay

## 2011-11-09 DIAGNOSIS — I498 Other specified cardiac arrhythmias: Secondary | ICD-10-CM | POA: Insufficient documentation

## 2011-11-09 DIAGNOSIS — I2510 Atherosclerotic heart disease of native coronary artery without angina pectoris: Secondary | ICD-10-CM | POA: Insufficient documentation

## 2011-11-09 DIAGNOSIS — I10 Essential (primary) hypertension: Secondary | ICD-10-CM | POA: Insufficient documentation

## 2011-11-09 DIAGNOSIS — E785 Hyperlipidemia, unspecified: Secondary | ICD-10-CM | POA: Insufficient documentation

## 2011-11-09 DIAGNOSIS — Z9861 Coronary angioplasty status: Secondary | ICD-10-CM | POA: Insufficient documentation

## 2011-11-09 DIAGNOSIS — I252 Old myocardial infarction: Secondary | ICD-10-CM | POA: Insufficient documentation

## 2011-11-09 DIAGNOSIS — Z5189 Encounter for other specified aftercare: Secondary | ICD-10-CM | POA: Insufficient documentation

## 2011-11-09 NOTE — Progress Notes (Signed)
Cardiac Rehab Medication Review by a Pharmacist  Does the patient  feel that his/her medications are working for him/her?  yes  Has the patient been experiencing any side effects to the medications prescribed?  no  Does the patient measure his/her own blood pressure or blood glucose at home?  yes checks blood pressure at home.   Does the patient have any problems obtaining medications due to transportation or finances?   no  Understanding of regimen: excellent Understanding of indications: good Potential of compliance: excellent uses an alarm system on cell phone.     Pharmacist comments:     Teresa Wolf 11/09/2011 9:11 AM

## 2011-11-13 ENCOUNTER — Encounter (HOSPITAL_COMMUNITY)
Admission: RE | Admit: 2011-11-13 | Discharge: 2011-11-13 | Disposition: A | Discharge status: Home / Self Care | Payer: BC Managed Care – PPO | Source: Ambulatory Visit | Attending: Cardiovascular Disease | Admitting: Cardiovascular Disease

## 2011-11-14 NOTE — Progress Notes (Signed)
Pt started cardiac rehab today.  Pt tolerated light exercise without difficulty. Telemetry Sinus rhythm Sinus Tach with T wave inversion which was noted on office 12 lead ECG on 11/06/10.Vital signs stable.Will continue to monitor the patient throughout  the program.

## 2011-11-15 ENCOUNTER — Encounter (HOSPITAL_COMMUNITY)
Admission: RE | Admit: 2011-11-15 | Discharge: 2011-11-15 | Disposition: A | Discharge status: Home / Self Care | Payer: BC Managed Care – PPO | Source: Ambulatory Visit | Attending: Cardiovascular Disease | Admitting: Cardiovascular Disease

## 2011-11-16 NOTE — Progress Notes (Signed)
Teresa Wolf 46 y.o. female       Nutrition Screen                                                                    YES  NO Do you live in a nursing home?  X   Do you eat out more than 3 times/week?   X  If yes, how many times per week do you eat out? 4-5 times  Do you have food allergies?   X If yes, what are you allergic to?  Have you gained or lost more than 10 lbs without trying?               X If yes, how much weight have you lost and over what time period?  lbs gained or lost over  weeks/month  Do you want to lose weight?    X  If yes, what is a goal weight or amount of weight you would like to lose?  30-40 lbs  Do you eat alone most of the time?   X   Do you eat less than 2 meals/day?  X If yes, how many meals do you eat?  Do you drink more than 3 alcohol drinks/day?  X If yes, how many drinks per day?  Are you having trouble with constipation? *  X If yes, what are you doing to help relieve constipation?  Do you have financial difficulties with buying food?*    X   Are you experiencing regular nausea/ vomiting?*     X   Do you have a poor appetite? *                                        X   Do you have trouble chewing/swallowing? *   X    Pt with diagnoses of:  X Stent/ PTCA X Dyslipidemia  / HDL< 40 / LDL>70 / High TG      X %  Body fat >goal / Body Mass Index >25 X HTN / BP >120/80 X MI       Pt Risk Score   2       Diagnosis Risk Score  25       Total Risk Score   27                         High Risk               X Low Risk    HT: 61" Ht Readings from Last 1 Encounters:  11/09/11 5\' 1"  (1.549 m)    WT:   165 lb (75 kg) Wt Readings from Last 3 Encounters:  11/09/11 165 lb 5.5 oz (75 kg)  11/03/11 164 lb (74.39 kg)  10/20/11 163 lb 12.8 oz (74.3 kg)     IBW 47.7 157%IBW BMI 31.2 40.7%body fat  Meds reviewed: Calcium Carbonate with vitamin D Past Medical History  Diagnosis Date  . Cluster headaches     sporadic  . Hypertension   . Depression   .  Anxiety   . HLD (hyperlipidemia)   .  CAD in native artery     s/p Ant STEMI 10/18/11;  Emergent LHC 10/18/11: LAD occluded with thrombus, circumflex patent, RCA patent, EF 35% with mid anterior akinesis extending into the apex and distal inferior wall.  PCI: Promus DES to the mid LAD.  Follow up echocardiogram 10/18/11: EF 60-65%, mild focal basal septal hypertrophy, severe hypokinesis of the mid to distal anterior septal myocardium, grade 2 diastolic dysfuncti  . SVT (supraventricular tachycardia)     post MI 2/13 converted with adenosine       Activity level: Pt is moderately active  Wt goal: 141-153 lb ( 64.1-69.5 kg) Current tobacco use? No      Food/Drug Interaction? No       Labs:  Lipid Panel     Component Value Date/Time   CHOL 180 10/18/2011 0547   TRIG 76 10/18/2011 0547   HDL 46 10/18/2011 0547   CHOLHDL 3.9 10/18/2011 0547   VLDL 15 10/18/2011 0547   LDLCALC 119* 10/18/2011 0547    Lab Results  Component Value Date   HGBA1C 5.6 10/18/2011   10/18/11 Glucose 187  LDL goal:< 70      MI and > 2:      HTN, family h/o Estimated Daily Nutrition Needs for: ? wt loss  1250-1500 Kcal , Total Fat 35-40gm, Saturated Fat 9-12 gm, Trans Fat 1.2-1.5 gm,  Sodium less than 1500 mg

## 2011-11-17 ENCOUNTER — Encounter (HOSPITAL_COMMUNITY)
Admission: RE | Admit: 2011-11-17 | Discharge: 2011-11-17 | Disposition: A | Discharge status: Home / Self Care | Payer: BC Managed Care – PPO | Source: Ambulatory Visit | Attending: Cardiovascular Disease | Admitting: Cardiovascular Disease

## 2011-11-20 ENCOUNTER — Ambulatory Visit: Payer: BC Managed Care – PPO | Admitting: Family Medicine

## 2011-11-20 ENCOUNTER — Encounter (HOSPITAL_COMMUNITY)
Admission: RE | Admit: 2011-11-20 | Discharge: 2011-11-20 | Disposition: A | Discharge status: Home / Self Care | Payer: BC Managed Care – PPO | Source: Ambulatory Visit | Attending: Cardiovascular Disease | Admitting: Cardiovascular Disease

## 2011-11-20 NOTE — Progress Notes (Signed)
Reviewed home exercise with pt today.  Pt plans to walk and use elliptical at home for exercise.  Reviewed THR, pulse, RPE, sign and symptoms, NTG use, and when to call 911 or MD.  Pt voiced understanding. Torrin Frein, MA, ACSM RCEP  

## 2011-11-22 ENCOUNTER — Encounter (HOSPITAL_COMMUNITY)
Admission: RE | Admit: 2011-11-22 | Discharge: 2011-11-22 | Disposition: A | Discharge status: Home / Self Care | Payer: BC Managed Care – PPO | Source: Ambulatory Visit | Attending: Cardiovascular Disease | Admitting: Cardiovascular Disease

## 2011-11-24 ENCOUNTER — Encounter (HOSPITAL_COMMUNITY)
Admission: RE | Admit: 2011-11-24 | Discharge: 2011-11-24 | Disposition: A | Discharge status: Home / Self Care | Payer: BC Managed Care – PPO | Source: Ambulatory Visit | Attending: Cardiovascular Disease | Admitting: Cardiovascular Disease

## 2011-11-27 ENCOUNTER — Encounter (HOSPITAL_COMMUNITY): Payer: BC Managed Care – PPO

## 2011-11-28 ENCOUNTER — Ambulatory Visit (INDEPENDENT_AMBULATORY_CARE_PROVIDER_SITE_OTHER): Payer: BC Managed Care – PPO | Admitting: Family Medicine

## 2011-11-28 ENCOUNTER — Encounter: Payer: Self-pay | Admitting: Family Medicine

## 2011-11-28 VITALS — BP 122/86 | HR 72 | Temp 98.0°F | Resp 12 | Ht 61.0 in | Wt 166.0 lb

## 2011-11-28 DIAGNOSIS — E785 Hyperlipidemia, unspecified: Secondary | ICD-10-CM

## 2011-11-28 DIAGNOSIS — I10 Essential (primary) hypertension: Secondary | ICD-10-CM

## 2011-11-28 DIAGNOSIS — Z8659 Personal history of other mental and behavioral disorders: Secondary | ICD-10-CM | POA: Insufficient documentation

## 2011-11-28 DIAGNOSIS — I2510 Atherosclerotic heart disease of native coronary artery without angina pectoris: Secondary | ICD-10-CM

## 2011-11-28 NOTE — Progress Notes (Signed)
  Subjective:    Patient ID: Teresa Wolf, female    DOB: 10/27/1965, 46 y.o.   MRN: [id number]  HPI  New to establish care. Patient has long history of hypertension and dyslipidemia. Developed substernal chest pain February 12 and was admitted with acute anterior ST elevation MI. She had acute left anterior descending vessel stent and has done well since that time. Currently takes Lipitor and Niaspan for lipids. Recent addition of lisinopril and metoprolol. She has not had any lightheadedness or dizziness. No syncope. No recurrent chest pain. She started walking some for exercise with cardiac rehabilitation and tolerating well. Nonsmoker. Strong family history of CAD.  Initial low EF with cath around 35% but improved with subsequent ECHO.  Past history depression. She took herself off Celexa a few weeks ago and has noticed fewer palpitations after stopping. She feels her mood is stable. No major anxiety issues.  She sees gynecologist regularly. Had recent Pap smear which was unremarkable.  Past Medical History  Diagnosis Date  . Cluster headaches     sporadic  . Hypertension   . Depression   . Anxiety   . HLD (hyperlipidemia)   . CAD in native artery     s/p Ant STEMI 10/18/11;  Emergent LHC 10/18/11: LAD occluded with thrombus, circumflex patent, RCA patent, EF 35% with mid anterior akinesis extending into the apex and distal inferior wall.  PCI: Promus DES to the mid LAD.  Follow up echocardiogram 10/18/11: EF 60-65%, mild focal basal septal hypertrophy, severe hypokinesis of the mid to distal anterior septal myocardium, grade 2 diastolic dysfuncti  . SVT (supraventricular tachycardia)     post MI 2/13 converted with adenosine   Past Surgical History  Procedure Date  . Cesarean section     reports that she has never smoked. She has never used smokeless tobacco. She reports that she drinks alcohol. She reports that she does not use illicit drugs. family history includes Breast cancer  in her maternal aunt; Cancer in her maternal grandmother and paternal grandfather; Diabetes in her maternal grandmother; Heart disease in her maternal grandmother; Heart disease (age of onset:57) in her mother; Hyperlipidemia in her maternal grandmother; and Osteoporosis in her mother. Allergies  Allergen Reactions  . Penicillins Hives      Review of Systems  Constitutional: Negative for fatigue.  Eyes: Negative for visual disturbance.  Respiratory: Negative for cough, chest tightness, shortness of breath and wheezing.   Cardiovascular: Negative for chest pain, palpitations and leg swelling.  Neurological: Negative for dizziness, seizures, syncope, weakness, light-headedness and headaches.       Objective:   Physical Exam  Constitutional: She appears well-developed and well-nourished.  HENT:  Right Ear: External ear normal.  Left Ear: External ear normal.  Mouth/Throat: Oropharynx is clear and moist.  Neck: Neck supple. No thyromegaly present.  Cardiovascular: Normal rate and regular rhythm.   Pulmonary/Chest: Effort normal and breath sounds normal. No respiratory distress. She has no wheezes. She has no rales.  Musculoskeletal: She exhibits no edema.  Lymphadenopathy:    She has no cervical adenopathy.          Assessment & Plan:  #1 history of CAD with recent MI as above. She has followup with cardiology in April and plan lipids and hepatic panel that time  #2 history of dyslipidemia currently treated with Lipitor and Niaspan and tolerating medications well  #3 hypertension stable  #4 history depression currently stable off medication. She rarely takes lorazepam for anxiety symptoms

## 2011-11-29 ENCOUNTER — Encounter (HOSPITAL_COMMUNITY): Payer: BC Managed Care – PPO

## 2011-12-01 ENCOUNTER — Encounter (HOSPITAL_COMMUNITY): Payer: BC Managed Care – PPO

## 2011-12-04 ENCOUNTER — Ambulatory Visit (INDEPENDENT_AMBULATORY_CARE_PROVIDER_SITE_OTHER): Payer: BC Managed Care – PPO | Admitting: Internal Medicine

## 2011-12-04 ENCOUNTER — Encounter (HOSPITAL_COMMUNITY): Payer: BC Managed Care – PPO

## 2011-12-04 ENCOUNTER — Encounter: Payer: Self-pay | Admitting: Internal Medicine

## 2011-12-04 VITALS — BP 100/70 | Temp 98.1°F | Wt 164.0 lb

## 2011-12-04 DIAGNOSIS — I2510 Atherosclerotic heart disease of native coronary artery without angina pectoris: Secondary | ICD-10-CM

## 2011-12-04 DIAGNOSIS — I10 Essential (primary) hypertension: Secondary | ICD-10-CM

## 2011-12-04 DIAGNOSIS — J069 Acute upper respiratory infection, unspecified: Secondary | ICD-10-CM

## 2011-12-04 MED ORDER — HYDROCODONE-HOMATROPINE 5-1.5 MG/5ML PO SYRP: 5.0000 mL | ORAL_SOLUTION | Freq: Four times a day (QID) | ORAL | Status: DC | PRN

## 2011-12-04 NOTE — Progress Notes (Signed)
  Subjective:    Patient ID: Teresa Wolf, female    DOB: 1966-02-01, 46 y.o.   MRN: [id number]  HPI  46 year old patient status post anterior STEMI in February of this year. She presents with a four-day history of hoarseness dry nonproductive cough and some nasal congestion. She has been using Delsym for relief of her cough. Denies any fever chills chest pain or shortness of breath    Review of Systems  Constitutional: Negative.   HENT: Positive for congestion and voice change. Negative for hearing loss, sore throat, rhinorrhea, dental problem, sinus pressure and tinnitus.   Eyes: Negative for pain, discharge and visual disturbance.  Respiratory: Positive for cough. Negative for shortness of breath.   Cardiovascular: Negative for chest pain, palpitations and leg swelling.  Gastrointestinal: Negative for nausea, vomiting, abdominal pain, diarrhea, constipation, blood in stool and abdominal distention.  Genitourinary: Negative for dysuria, urgency, frequency, hematuria, flank pain, vaginal bleeding, vaginal discharge, difficulty urinating, vaginal pain and pelvic pain.  Musculoskeletal: Negative for joint swelling, arthralgias and gait problem.  Skin: Negative for rash.  Neurological: Negative for dizziness, syncope, speech difficulty, weakness, numbness and headaches.  Hematological: Negative for adenopathy.  Psychiatric/Behavioral: Negative for behavioral problems, dysphoric mood and agitation. The patient is not nervous/anxious.        Objective:   Physical Exam  Constitutional: She is oriented to person, place, and time. She appears well-developed and well-nourished.  HENT:  Head: Normocephalic.  Right Ear: External ear normal.  Left Ear: External ear normal.  Mouth/Throat: Oropharynx is clear and moist.  Eyes: Conjunctivae and EOM are normal. Pupils are equal, round, and reactive to light.  Neck: Normal range of motion. Neck supple. No thyromegaly present.  Cardiovascular:  Normal rate, regular rhythm, normal heart sounds and intact distal pulses.   Pulmonary/Chest: Effort normal and breath sounds normal.  Abdominal: Soft. Bowel sounds are normal. She exhibits no mass. There is no tenderness.  Musculoskeletal: Normal range of motion.  Lymphadenopathy:    She has no cervical adenopathy.  Neurological: She is alert and oriented to person, place, and time.  Skin: Skin is warm and dry. No rash noted.  Psychiatric: She has a normal mood and affect. Her behavior is normal.          Assessment & Plan:    Viral URI. Will treat symptomatically. Was given a prescription for an anti-tussive. Will report any fever or any clinical worsening Coronary artery disease stable

## 2011-12-04 NOTE — Patient Instructions (Signed)
Get plenty of rest, Drink lots of  clear liquids, and use Tylenol for fever and discomfort.    Call or return to clinic prn if these symptoms worsen or fail to improve as anticipated.   

## 2011-12-06 ENCOUNTER — Encounter (HOSPITAL_COMMUNITY)
Admission: RE | Admit: 2011-12-06 | Discharge: 2011-12-06 | Disposition: A | Discharge status: Home / Self Care | Payer: BC Managed Care – PPO | Source: Ambulatory Visit | Attending: Cardiovascular Disease | Admitting: Cardiovascular Disease

## 2011-12-06 DIAGNOSIS — Z5189 Encounter for other specified aftercare: Secondary | ICD-10-CM | POA: Insufficient documentation

## 2011-12-06 DIAGNOSIS — I252 Old myocardial infarction: Secondary | ICD-10-CM | POA: Insufficient documentation

## 2011-12-06 DIAGNOSIS — I498 Other specified cardiac arrhythmias: Secondary | ICD-10-CM | POA: Insufficient documentation

## 2011-12-06 DIAGNOSIS — I2510 Atherosclerotic heart disease of native coronary artery without angina pectoris: Secondary | ICD-10-CM | POA: Insufficient documentation

## 2011-12-06 DIAGNOSIS — Z9861 Coronary angioplasty status: Secondary | ICD-10-CM | POA: Insufficient documentation

## 2011-12-06 DIAGNOSIS — I10 Essential (primary) hypertension: Secondary | ICD-10-CM | POA: Insufficient documentation

## 2011-12-06 DIAGNOSIS — E785 Hyperlipidemia, unspecified: Secondary | ICD-10-CM | POA: Insufficient documentation

## 2011-12-08 ENCOUNTER — Encounter (HOSPITAL_COMMUNITY): Payer: BC Managed Care – PPO

## 2011-12-11 ENCOUNTER — Encounter (HOSPITAL_COMMUNITY)
Admission: RE | Admit: 2011-12-11 | Discharge: 2011-12-11 | Disposition: A | Discharge status: Home / Self Care | Payer: BC Managed Care – PPO | Source: Ambulatory Visit | Attending: Cardiovascular Disease | Admitting: Cardiovascular Disease

## 2011-12-13 ENCOUNTER — Encounter (HOSPITAL_COMMUNITY)
Admission: RE | Admit: 2011-12-13 | Discharge: 2011-12-13 | Disposition: A | Discharge status: Home / Self Care | Payer: BC Managed Care – PPO | Source: Ambulatory Visit | Attending: Cardiovascular Disease | Admitting: Cardiovascular Disease

## 2011-12-15 ENCOUNTER — Encounter (HOSPITAL_COMMUNITY)
Admission: RE | Admit: 2011-12-15 | Discharge: 2011-12-15 | Disposition: A | Discharge status: Home / Self Care | Payer: BC Managed Care – PPO | Source: Ambulatory Visit | Attending: Cardiovascular Disease | Admitting: Cardiovascular Disease

## 2011-12-15 NOTE — Progress Notes (Addendum)
Teresa Wolf was noted to have a heart to slow down from the 90's to the 70's during cooldown  Patient asymptomatic.  Vital signs stable.  Will send  ECG tracings to Dr Excell Seltzer for review via epic.

## 2011-12-18 ENCOUNTER — Encounter (HOSPITAL_COMMUNITY)
Admission: RE | Admit: 2011-12-18 | Discharge: 2011-12-18 | Disposition: A | Discharge status: Home / Self Care | Payer: BC Managed Care – PPO | Source: Ambulatory Visit | Attending: Cardiovascular Disease | Admitting: Cardiovascular Disease

## 2011-12-18 NOTE — Progress Notes (Signed)
Teresa Wolf 46 y.o. female Nutrition Note  Spoke with pt.  Nutrition Plan and Nutrition Survey reviewed with pt. Pt is following Step 1  of the Therapeutic Lifestyle Changes diet. Pt reports her highest wt in 10/2009 was 199 lbs. Pt started her 60 lb wt loss by taking vitamin B-12 and HCG shots at the bariatric clinic. Pt has maintained 40 lb wt loss from her highest wt over the past year. Weight loss tips reviewed. Pt's long-term goal wt is 125 lbs.   Nutrition Diagnosis   Food-and nutrition-related knowledge deficit related to lack of exposure to information as related to diagnosis of: ? CVD   Obesity related to excessive energy intake as evidenced by a BMI of 31.2 Nutrition RX/ Estimated Daily Nutrition Needs for: wt loss  1250-1500 Kcal, 35-40 gm fat, 9-12 gm sat fat, 1.2-1.5 gm trans-fat, <1500 mg sodium  Nutrition Intervention   Pt's individual nutrition plan including cholesterol goals reviewed with pt.   Benefits of adopting Therapeutic Lifestyle Changes discussed when Medficts reviewed.   Pt to attend the Portion Distortion class   Pt given handouts for ? Nutrition I class                       ? Nutrition II class      ? Weight loss   Continue client-centered nutrition education by RD, as part of interdisciplinary care. Goal(s)   Pt to identify and limit food sources of saturated fat, trans fat, and cholesterol   Pt to identify food quantities necessary to achieve: ? wt loss to a goal wt of 141-153 lb at graduation from cardiac rehab.  Monitor and Evaluate progress toward nutrition goal with team.

## 2011-12-20 ENCOUNTER — Encounter: Payer: Self-pay | Admitting: Cardiovascular Disease

## 2011-12-20 ENCOUNTER — Other Ambulatory Visit (INDEPENDENT_AMBULATORY_CARE_PROVIDER_SITE_OTHER): Payer: BC Managed Care – PPO

## 2011-12-20 ENCOUNTER — Encounter (HOSPITAL_COMMUNITY): Payer: BC Managed Care – PPO

## 2011-12-20 ENCOUNTER — Telehealth: Payer: Self-pay | Admitting: *Deleted

## 2011-12-20 ENCOUNTER — Ambulatory Visit (INDEPENDENT_AMBULATORY_CARE_PROVIDER_SITE_OTHER): Payer: BC Managed Care – PPO | Admitting: Cardiovascular Disease

## 2011-12-20 VITALS — BP 129/87 | HR 75 | Ht 61.0 in | Wt 165.0 lb

## 2011-12-20 DIAGNOSIS — I10 Essential (primary) hypertension: Secondary | ICD-10-CM

## 2011-12-20 DIAGNOSIS — I219 Acute myocardial infarction, unspecified: Secondary | ICD-10-CM

## 2011-12-20 DIAGNOSIS — I213 ST elevation (STEMI) myocardial infarction of unspecified site: Secondary | ICD-10-CM

## 2011-12-20 DIAGNOSIS — E785 Hyperlipidemia, unspecified: Secondary | ICD-10-CM

## 2011-12-20 LAB — HEPATIC FUNCTION PANEL
ALT: 19 U/L (ref 0–35)
AST: 20 U/L (ref 0–37)
Albumin: 4 g/dL (ref 3.5–5.2)
Total Bilirubin: 0.6 mg/dL (ref 0.3–1.2)

## 2011-12-20 LAB — LIPID PANEL
HDL: 42 mg/dL (ref >39.00)
Total CHOL/HDL Ratio: 3
Triglycerides: 38 mg/dL (ref 0.0–149.0)
VLDL: 7.6 mg/dL (ref 0.0–40.0)

## 2011-12-20 NOTE — Telephone Encounter (Signed)
ptcb and has been notified of lab results and lipid panel #'s, gave verbal understanding today. Danielle Rankin

## 2011-12-20 NOTE — Patient Instructions (Signed)
Your physician wants you to follow-up in: 6 months.  You will receive a reminder letter in the mail two months in advance. If you don't receive a letter, please call our office to schedule the follow-up appointment.  We will call you with the results of lab work done today 

## 2011-12-20 NOTE — Telephone Encounter (Signed)
Message copied by Tarri Fuller on Wed Dec 20, 2011  3:03 PM ------      Message from: [place], [place] T      Created: Wed Dec 20, 2011  1:08 PM       Good      Tereso Newcomer, PA-C  1:08 PM 12/20/2011

## 2011-12-20 NOTE — Telephone Encounter (Signed)
Message copied by Tarri Fuller on Wed Dec 20, 2011  3:28 PM ------      Message from: [place], [place] T      Created: Wed Dec 20, 2011  1:08 PM       Good      Tereso Newcomer, PA-C  1:08 PM 12/20/2011

## 2011-12-20 NOTE — Telephone Encounter (Signed)
no answer, home # is mobile jack and is not set up yet. no answer on mobile and no voice mail to lmom. I will try again later. Danielle Rankin

## 2011-12-21 ENCOUNTER — Encounter: Payer: Self-pay | Admitting: Cardiovascular Disease

## 2011-12-22 ENCOUNTER — Encounter (HOSPITAL_COMMUNITY): Payer: BC Managed Care – PPO

## 2011-12-24 ENCOUNTER — Encounter: Payer: Self-pay | Admitting: Cardiovascular Disease

## 2011-12-24 NOTE — Progress Notes (Signed)
   HPI:  46 year-old woman with history of HTN and hyperlipidemia presents for follow-up. She had an anterior MI in February 2012 and was treated with Primary PCI of the mid-LAD using a DES platform. LVEF following her infarct normalized to the range of 60-65%. She had SVT in the hospital requiring adenosine administration.  She has had a very good recovery. She's been faithful with cardiac rehab. No exertional symptoms. No CP, dyspnea, edema, or palpitations. She's been watching her diet. No complaints today.  Outpatient Encounter Prescriptions as of 12/20/2011  Medication Sig Dispense Refill  . acetaminophen (TYLENOL) 500 MG tablet Take 1,000 mg by mouth every 6 (six) hours as needed.      Marland Kitchen aspirin 81 MG chewable tablet Chew 81 mg by mouth once.       Marland Kitchen atorvastatin (LIPITOR) 40 MG tablet Take 1 tablet (40 mg total) by mouth daily.  30 tablet  3  . Calcium Carbonate-Vit D-Min (CALCIUM 1200 PO) Take by mouth.      . clopidogrel (PLAVIX) 75 MG tablet Take 1 tablet (75 mg total) by mouth daily with breakfast.  30 tablet  3  . famotidine (PEPCID) 20 MG tablet Take 20 mg by mouth daily as needed.      Marland Kitchen lisinopril (PRINIVIL,ZESTRIL) 5 MG tablet Take 1 tablet (5 mg total) by mouth daily.  30 tablet  3  . LORazepam (ATIVAN) 1 MG tablet Take 1 mg by mouth every 8 (eight) hours as needed.       . metoprolol (LOPRESSOR) 50 MG tablet Take 1 tablet (50 mg total) by mouth 2 (two) times daily.  60 tablet  3  . niacin (NIASPAN) 1000 MG CR tablet Take 1 tablet (1,000 mg total) by mouth at bedtime.  30 tablet  3  . nitroGLYCERIN (NITROSTAT) 0.4 MG SL tablet Place 0.4 mg under the tongue every 5 (five) minutes as needed. Chest pain Total of 4 given en route      . zolpidem (AMBIEN) 10 MG tablet As needed        Allergies  Allergen Reactions  . Penicillins Hives    Past Medical History  Diagnosis Date  . Cluster headaches     sporadic  . Hypertension   . Depression   . Anxiety   . HLD  (hyperlipidemia)   . CAD in native artery     s/p Ant STEMI 10/18/11;  Emergent LHC 10/18/11: LAD occluded with thrombus, circumflex patent, RCA patent, EF 35% with mid anterior akinesis extending into the apex and distal inferior wall.  PCI: Promus DES to the mid LAD.  Follow up echocardiogram 10/18/11: EF 60-65%, mild focal basal septal hypertrophy, severe hypokinesis of the mid to distal anterior septal myocardium, grade 2 diastolic dysfuncti  . SVT (supraventricular tachycardia)     post MI 2/13 converted with adenosine    ROS: Negative except as per HPI  BP 129/87  Pulse 75  Ht 5\' 1"  (1.549 m)  Wt 74.844 kg (165 lb)  BMI 31.18 kg/m2  PHYSICAL EXAM: Pt is alert and oriented, pleasant young woman in NAD HEENT: normal Neck: JVP - normal, carotids 2+= without bruits Lungs: CTA bilaterally CV: RRR without murmur or gallop Abd: soft, NT, Positive BS Ext: no C/C/E, distal pulses intact and equal Skin: warm/dry no rash  ASSESSMENT AND PLAN:

## 2011-12-24 NOTE — Assessment & Plan Note (Signed)
BP controlled on current meds> continue same... 

## 2011-12-24 NOTE — Assessment & Plan Note (Signed)
Pt is stable now 2 months out from her infarct. She will continue on her current medical program to include DAPT for 12 months from stent implant (through Feb 2014). Continue med Rx, lifestyle modification, and follow-up in 6 months.

## 2011-12-24 NOTE — Assessment & Plan Note (Signed)
LDL at goal of less than 70 mg/dL on today's labs. Continue same Rx.

## 2011-12-25 ENCOUNTER — Encounter (HOSPITAL_COMMUNITY)
Admission: RE | Admit: 2011-12-25 | Discharge: 2011-12-25 | Disposition: A | Discharge status: Home / Self Care | Payer: BC Managed Care – PPO | Source: Ambulatory Visit | Attending: Cardiovascular Disease | Admitting: Cardiovascular Disease

## 2011-12-27 ENCOUNTER — Encounter (HOSPITAL_COMMUNITY)
Admission: RE | Admit: 2011-12-27 | Discharge: 2011-12-27 | Disposition: A | Discharge status: Home / Self Care | Payer: BC Managed Care – PPO | Source: Ambulatory Visit | Attending: Cardiovascular Disease | Admitting: Cardiovascular Disease

## 2011-12-29 ENCOUNTER — Encounter (HOSPITAL_COMMUNITY): Payer: BC Managed Care – PPO

## 2012-01-01 ENCOUNTER — Encounter (HOSPITAL_COMMUNITY): Payer: BC Managed Care – PPO

## 2012-01-02 ENCOUNTER — Ambulatory Visit (INDEPENDENT_AMBULATORY_CARE_PROVIDER_SITE_OTHER): Payer: BC Managed Care – PPO | Admitting: Family Medicine

## 2012-01-02 ENCOUNTER — Encounter: Payer: Self-pay | Admitting: Family Medicine

## 2012-01-02 VITALS — BP 98/60 | Temp 98.0°F | Wt 168.0 lb

## 2012-01-02 DIAGNOSIS — R05 Cough: Secondary | ICD-10-CM

## 2012-01-02 DIAGNOSIS — R059 Cough, unspecified: Secondary | ICD-10-CM

## 2012-01-02 MED ORDER — HYDROCODONE-HOMATROPINE 5-1.5 MG/5ML PO SYRP: 5.0000 mL | ORAL_SOLUTION | Freq: Four times a day (QID) | ORAL | Status: AC | PRN

## 2012-01-02 NOTE — Patient Instructions (Signed)
Follow up promptly for any fever or worsening cough or shortness of breath.

## 2012-01-02 NOTE — Progress Notes (Signed)
  Subjective:    Patient ID: Teresa Wolf, female    DOB: 18-Apr-1966, 46 y.o.   MRN: [id number]  HPI  Acute visit. 3 day history of dry cough. Now some nasal congestion as well. Denies any fever or chills. No wheezing. No active GERD symptoms. Nonsmoker. Similar process about a month ago which did improve and eventually resolved. She has some Hycodan cough syrup which has helped. She did use Delsym cough syrup last night which helps slightly. Denies any nausea, vomiting, or diarrhea.  She remains active with cardiac rehabilitation. No recurrent chest pain.   Review of Systems  Constitutional: Negative for fever and chills.  HENT: Positive for congestion. Negative for sore throat and voice change.   Respiratory: Positive for cough. Negative for shortness of breath and wheezing.   Cardiovascular: Negative for chest pain.  Neurological: Negative for syncope and headaches.       Objective:   Physical Exam  Constitutional: She appears well-developed and well-nourished.  HENT:  Right Ear: External ear normal.  Left Ear: External ear normal.  Mouth/Throat: Oropharynx is clear and moist.  Neck: Neck supple.  Cardiovascular: Normal rate and regular rhythm.   Pulmonary/Chest: Effort normal and breath sounds normal. No respiratory distress. She has no wheezes. She has no rales.          Assessment & Plan:  Cough. Suspect viral bronchitis. Refill Hycodan cough syrup. Follow up promptly for any fever or worsening symptoms.  We discussed the fact that some of this may be allergy related as well. She'll try over-the-counter plain antihistamines such as Allegra but avoid decongestants

## 2012-01-03 ENCOUNTER — Encounter (HOSPITAL_COMMUNITY): Payer: BC Managed Care – PPO

## 2012-01-05 ENCOUNTER — Encounter (HOSPITAL_COMMUNITY): Payer: BC Managed Care – PPO

## 2012-01-08 ENCOUNTER — Encounter (HOSPITAL_COMMUNITY)
Admission: RE | Admit: 2012-01-08 | Discharge: 2012-01-08 | Disposition: A | Discharge status: Home / Self Care | Payer: BC Managed Care – PPO | Source: Ambulatory Visit | Attending: Cardiovascular Disease | Admitting: Cardiovascular Disease

## 2012-01-08 DIAGNOSIS — I498 Other specified cardiac arrhythmias: Secondary | ICD-10-CM | POA: Insufficient documentation

## 2012-01-08 DIAGNOSIS — I252 Old myocardial infarction: Secondary | ICD-10-CM | POA: Insufficient documentation

## 2012-01-08 DIAGNOSIS — E785 Hyperlipidemia, unspecified: Secondary | ICD-10-CM | POA: Insufficient documentation

## 2012-01-08 DIAGNOSIS — I2510 Atherosclerotic heart disease of native coronary artery without angina pectoris: Secondary | ICD-10-CM | POA: Insufficient documentation

## 2012-01-08 DIAGNOSIS — Z5189 Encounter for other specified aftercare: Secondary | ICD-10-CM | POA: Insufficient documentation

## 2012-01-08 DIAGNOSIS — I10 Essential (primary) hypertension: Secondary | ICD-10-CM | POA: Insufficient documentation

## 2012-01-08 DIAGNOSIS — Z9861 Coronary angioplasty status: Secondary | ICD-10-CM | POA: Insufficient documentation

## 2012-01-10 ENCOUNTER — Encounter (HOSPITAL_COMMUNITY): Payer: BC Managed Care – PPO

## 2012-01-12 ENCOUNTER — Encounter (HOSPITAL_COMMUNITY): Payer: BC Managed Care – PPO

## 2012-01-15 ENCOUNTER — Encounter (HOSPITAL_COMMUNITY): Payer: BC Managed Care – PPO

## 2012-01-17 ENCOUNTER — Encounter (HOSPITAL_COMMUNITY): Payer: BC Managed Care – PPO

## 2012-01-19 ENCOUNTER — Encounter (HOSPITAL_COMMUNITY): Payer: BC Managed Care – PPO

## 2012-01-22 ENCOUNTER — Encounter (HOSPITAL_COMMUNITY): Payer: BC Managed Care – PPO

## 2012-01-24 ENCOUNTER — Encounter (HOSPITAL_COMMUNITY): Admission: RE | Admit: 2012-01-24 | Payer: BC Managed Care – PPO | Source: Ambulatory Visit

## 2012-01-26 ENCOUNTER — Encounter (HOSPITAL_COMMUNITY): Payer: BC Managed Care – PPO

## 2012-01-29 ENCOUNTER — Encounter (HOSPITAL_COMMUNITY): Payer: BC Managed Care – PPO

## 2012-01-31 ENCOUNTER — Encounter (HOSPITAL_COMMUNITY): Payer: BC Managed Care – PPO

## 2012-01-31 ENCOUNTER — Telehealth (HOSPITAL_COMMUNITY): Payer: Self-pay | Admitting: *Deleted

## 2012-02-02 ENCOUNTER — Encounter (HOSPITAL_COMMUNITY): Payer: BC Managed Care – PPO

## 2012-02-05 ENCOUNTER — Encounter (HOSPITAL_COMMUNITY): Payer: BC Managed Care – PPO

## 2012-02-07 ENCOUNTER — Encounter (HOSPITAL_COMMUNITY): Payer: BC Managed Care – PPO

## 2012-02-09 ENCOUNTER — Encounter (HOSPITAL_COMMUNITY): Payer: BC Managed Care – PPO

## 2012-02-12 ENCOUNTER — Encounter (HOSPITAL_COMMUNITY): Payer: BC Managed Care – PPO

## 2012-02-14 ENCOUNTER — Encounter (HOSPITAL_COMMUNITY): Payer: BC Managed Care – PPO

## 2012-02-16 ENCOUNTER — Encounter (HOSPITAL_COMMUNITY)
Admission: RE | Admit: 2012-02-16 | Discharge: 2012-02-16 | Disposition: A | Discharge status: Home / Self Care | Payer: BC Managed Care – PPO | Source: Ambulatory Visit | Attending: Cardiovascular Disease | Admitting: Cardiovascular Disease

## 2012-02-16 ENCOUNTER — Other Ambulatory Visit (HOSPITAL_COMMUNITY): Payer: Self-pay | Admitting: Physician Assistant

## 2012-02-16 DIAGNOSIS — I252 Old myocardial infarction: Secondary | ICD-10-CM | POA: Insufficient documentation

## 2012-02-16 DIAGNOSIS — E785 Hyperlipidemia, unspecified: Secondary | ICD-10-CM | POA: Insufficient documentation

## 2012-02-16 DIAGNOSIS — I498 Other specified cardiac arrhythmias: Secondary | ICD-10-CM | POA: Insufficient documentation

## 2012-02-16 DIAGNOSIS — Z9861 Coronary angioplasty status: Secondary | ICD-10-CM | POA: Insufficient documentation

## 2012-02-16 DIAGNOSIS — I10 Essential (primary) hypertension: Secondary | ICD-10-CM | POA: Insufficient documentation

## 2012-02-16 DIAGNOSIS — Z5189 Encounter for other specified aftercare: Secondary | ICD-10-CM | POA: Insufficient documentation

## 2012-02-16 DIAGNOSIS — I2510 Atherosclerotic heart disease of native coronary artery without angina pectoris: Secondary | ICD-10-CM | POA: Insufficient documentation

## 2012-02-16 NOTE — Progress Notes (Signed)
Teresa Wolf has been absent since 01/08/2012. Will discharge from the program due to nonattendance.

## 2012-02-19 ENCOUNTER — Ambulatory Visit (HOSPITAL_COMMUNITY): Payer: BC Managed Care – PPO

## 2012-02-19 NOTE — Progress Notes (Signed)
Cardiac Rehabilitation Program Outcomes Report Orientation:  11/09/2011 Discharge Date:  02/16/2012  # of sessions completed: 14/36  Cardiologist: Excell Seltzer Family MD:   Class Time:  1445  A.  Exercise Program:  Tolerates exercise @ 5.0 METS for 30 minutes, Bike Test Results:  Pre: 0.83 mile, Needs encouragement on exercise program and Discharged to home exercise program.  Anticipated compliance:  good  B.  Mental Health:  Good mental attitude and Quality of Life (QOL) Pre Scores Only:  Overall  25.23, Health/Functioning 25.11, Socioeconomics 24.00, Psych/Spiritual 24.86, Family 28.50, PHQ: 0    C.  Education/Instruction/Skills  Accurately checks own pulse.  Rest:  88  Exercise:  141, Knows THR for exercise, Uses Perceived Exertion Scale and/or Dyspnea Scale and Attended 5/13 education classes  Home exercise given: 11/20/2011  D.  Nutrition/Weight Control/Body Composition:  Pt following a step 1Therapeutic Lifestyle Changes diet on admission. Post-nutrition survey not available to evaluate.  Pt wt is up 1.7 kg. Section Completed by: Mickle Plumb, M.Ed, RD, LDN, CDE  E.  Blood Lipids  Lab Results  Component Value Date   CHOL 113 12/20/2011   HDL 42.00 12/20/2011   LDLCALC 63 12/20/2011   TRIG 38.0 12/20/2011   CHOLHDL 3 12/20/2011   F.  Lifestyle Changes:  Making positive lifestyle changes  G.  Symptoms noted with exercise:  Asymptomatic  Report Completed By:  Fabio Pierce, MA, ACSM RCEP  Comments:  Pt was dropped from program after attendance dropped after returning to work.  Pt did well in program progressing from 2.5 METs to 5.0 METs.  Pt plans to continue to exercise by walking and using elliptical at home.  At d/c pt was in normal sinus.  Thanks for the referral.

## 2012-02-21 ENCOUNTER — Ambulatory Visit (HOSPITAL_COMMUNITY): Payer: BC Managed Care – PPO

## 2012-02-23 ENCOUNTER — Ambulatory Visit (HOSPITAL_COMMUNITY): Payer: BC Managed Care – PPO

## 2012-02-26 ENCOUNTER — Ambulatory Visit (HOSPITAL_COMMUNITY): Payer: BC Managed Care – PPO

## 2012-02-28 ENCOUNTER — Inpatient Hospital Stay (HOSPITAL_COMMUNITY): Admission: RE | Admit: 2012-02-28 | Payer: BC Managed Care – PPO | Source: Ambulatory Visit

## 2012-03-01 ENCOUNTER — Ambulatory Visit (HOSPITAL_COMMUNITY): Payer: BC Managed Care – PPO

## 2012-03-04 ENCOUNTER — Ambulatory Visit (HOSPITAL_COMMUNITY): Payer: BC Managed Care – PPO

## 2012-03-06 ENCOUNTER — Ambulatory Visit (HOSPITAL_COMMUNITY): Payer: BC Managed Care – PPO

## 2012-03-08 ENCOUNTER — Ambulatory Visit (HOSPITAL_COMMUNITY): Payer: BC Managed Care – PPO

## 2012-03-11 ENCOUNTER — Ambulatory Visit (HOSPITAL_COMMUNITY): Payer: BC Managed Care – PPO

## 2012-03-12 ENCOUNTER — Other Ambulatory Visit (HOSPITAL_COMMUNITY): Payer: Self-pay | Admitting: Physician Assistant

## 2012-03-13 ENCOUNTER — Ambulatory Visit (HOSPITAL_COMMUNITY): Payer: BC Managed Care – PPO

## 2012-03-15 ENCOUNTER — Ambulatory Visit (HOSPITAL_COMMUNITY): Payer: BC Managed Care – PPO

## 2012-07-25 ENCOUNTER — Encounter: Payer: Self-pay | Admitting: Cardiovascular Disease

## 2012-07-25 ENCOUNTER — Ambulatory Visit (INDEPENDENT_AMBULATORY_CARE_PROVIDER_SITE_OTHER): Payer: BC Managed Care – PPO | Admitting: Cardiovascular Disease

## 2012-07-25 VITALS — BP 122/90 | HR 74 | Ht 61.0 in | Wt 170.0 lb

## 2012-07-25 DIAGNOSIS — I10 Essential (primary) hypertension: Secondary | ICD-10-CM

## 2012-07-25 DIAGNOSIS — I2510 Atherosclerotic heart disease of native coronary artery without angina pectoris: Secondary | ICD-10-CM

## 2012-07-25 DIAGNOSIS — E785 Hyperlipidemia, unspecified: Secondary | ICD-10-CM

## 2012-07-25 LAB — HEPATIC FUNCTION PANEL
AST: 18 U/L (ref 0–37)
Alkaline Phosphatase: 66 U/L (ref 39–117)
Bilirubin, Direct: 0.2 mg/dL (ref 0.0–0.3)

## 2012-07-25 LAB — BASIC METABOLIC PANEL
Calcium: 9.1 mg/dL (ref 8.4–10.5)
GFR: 81.36 mL/min (ref >60.00)
Potassium: 4.5 mEq/L (ref 3.5–5.1)
Sodium: 135 mEq/L (ref 135–145)

## 2012-07-25 LAB — LIPID PANEL
HDL: 35.9 mg/dL — ABNORMAL LOW (ref >39.00)
LDL Cholesterol: 71 mg/dL (ref 0–99)
Total CHOL/HDL Ratio: 3
VLDL: 14.2 mg/dL (ref 0.0–40.0)

## 2012-07-25 MED ORDER — LISINOPRIL 10 MG PO TABS: 10.0000 mg | ORAL_TABLET | Freq: Every day | ORAL | Status: DC

## 2012-07-25 NOTE — Patient Instructions (Signed)
Your physician has recommended you make the following change in your medication: INCREASE Lisinopril to 10mg  take one by mouth daily, You can stop PLAVIX at the end of the month in February 2014  Your physician recommends that you have lab work today: BMP, LIVER and LIPID  Your physician wants you to follow-up in: 1 YEAR with Dr Excell Seltzer.  You will receive a reminder letter in the mail two months in advance. If you don't receive a letter, please call our office to schedule the follow-up appointment.

## 2012-07-25 NOTE — Progress Notes (Signed)
HPI:  46 year old woman presenting for followup of coronary artery disease. She presented in February 2013 with an anterior infarct. She had total occlusion of the LAD and was treated with a drug-eluting stent. There was no other significant CAD noted. Her post MI left ventricular ejection fraction is 60-65%. She has tolerated medical therapy well.  She denies chest pain, chest pressure, dyspnea, orthopnea, or PND. She does have episodic palpitations which have not changed in frequency or intensity. She has not had lightheadedness or syncope.  Outpatient Encounter Prescriptions as of 07/25/2012  Medication Sig Dispense Refill  . acetaminophen (TYLENOL) 500 MG tablet Take 1,000 mg by mouth every 6 (six) hours as needed.      Marland Kitchen aspirin 81 MG chewable tablet Chew 81 mg by mouth once.       Marland Kitchen atorvastatin (LIPITOR) 40 MG tablet TAKE 1 TABLET (40 MG TOTAL) BY MOUTH DAILY.  30 tablet  3  . Calcium Carbonate-Vit D-Min (CALCIUM 1200 PO) Take by mouth.      . clopidogrel (PLAVIX) 75 MG tablet TAKE 1 TABLET (75 MG TOTAL) BY MOUTH DAILY WITH BREAKFAST.  30 tablet  9  . famotidine (PEPCID) 20 MG tablet Take 20 mg by mouth daily as needed.      Marland Kitchen lisinopril (PRINIVIL,ZESTRIL) 5 MG tablet TAKE 1 TABLET (5 MG TOTAL) BY MOUTH DAILY.  30 tablet  9  . LORazepam (ATIVAN) 1 MG tablet Take 1 mg by mouth every 8 (eight) hours as needed.       . metoprolol (LOPRESSOR) 50 MG tablet TAKE 1 TABLET (50 MG TOTAL) BY MOUTH 2 (TWO) TIMES DAILY.  60 tablet  9  . NIASPAN 1000 MG CR tablet TAKE 1 TABLET (1,000 MG TOTAL) BY MOUTH AT BEDTIME.  30 tablet  3  . nitroGLYCERIN (NITROSTAT) 0.4 MG SL tablet Place 0.4 mg under the tongue every 5 (five) minutes as needed. Chest pain Total of 4 given en route      . [DISCONTINUED] zolpidem (AMBIEN) 10 MG tablet As needed        Allergies  Allergen Reactions  . Penicillins Hives    Past Medical History  Diagnosis Date  . Cluster headaches     sporadic  . Hypertension   .  Depression   . Anxiety   . HLD (hyperlipidemia)   . CAD in native artery     s/p Ant STEMI 10/18/11;  Emergent LHC 10/18/11: LAD occluded with thrombus, circumflex patent, RCA patent, EF 35% with mid anterior akinesis extending into the apex and distal inferior wall.  PCI: Promus DES to the mid LAD.  Follow up echocardiogram 10/18/11: EF 60-65%, mild focal basal septal hypertrophy, severe hypokinesis of the mid to distal anterior septal myocardium, grade 2 diastolic dysfuncti  . SVT (supraventricular tachycardia)     post MI 2/13 converted with adenosine    ROS: Negative except as per HPI  BP 122/90  Pulse 74  Ht 5\' 1"  (1.549 m)  Wt 77.111 kg (170 lb)  BMI 32.12 kg/m2  PHYSICAL EXAM: Pt is alert and oriented, NAD HEENT: normal Neck: JVP - normal, carotids 2+= without bruits Lungs: CTA bilaterally CV: RRR without murmur or gallop Abd: soft, NT, Positive BS, no hepatomegaly Ext: no C/C/E, distal pulses intact and equal Skin: warm/dry no rash  EKG:  Rhythm 74 beats per minute, within normal limits.  ASSESSMENT AND PLAN: 1. Coronary artery disease, native vessel. She remains on dual antiplatelet therapy with aspirin and Plavix. Plavix  can be stopped at the end of February 2014 when she is 12 months out from her myocardial infarction. She should remain on low-dose aspirin lifelong. She is having no anginal symptoms. I encouraged her to reinitiate efforts at a regular exercise program.  2. Hyperlipidemia. The patient is on a combination of atorvastatin and niacin. Her lipids have been at goal with last LDL cholesterol of 63. Plan repeat lipids and LFTs today. She requires routine monitoring of liver function tests because of dual therapy.  3. Essential hypertension. Diastolic blood pressure is elevated today. I rechecked her blood pressure and confirmed this. Recommend increase lisinopril to 10 mg daily.  For followup I would like to see her back in 12 months.  Tonny Bollman 07/25/2012 9:50 AM

## 2012-07-26 ENCOUNTER — Encounter: Payer: Self-pay | Admitting: Cardiovascular Disease

## 2012-07-26 NOTE — Telephone Encounter (Signed)
This encounter was created in error - please disregard.

## 2012-07-26 NOTE — Telephone Encounter (Signed)
Pt wants results of blood test, pls call [phone] may have to ask for Annaleigh weston if they don't recognize [place] she just got married

## 2012-09-05 ENCOUNTER — Other Ambulatory Visit: Payer: Self-pay | Admitting: Cardiovascular Disease

## 2012-10-19 ENCOUNTER — Other Ambulatory Visit: Payer: Self-pay

## 2012-11-28 ENCOUNTER — Ambulatory Visit (INDEPENDENT_AMBULATORY_CARE_PROVIDER_SITE_OTHER): Payer: BC Managed Care – PPO | Admitting: Family Medicine

## 2012-11-28 ENCOUNTER — Encounter: Payer: Self-pay | Admitting: Family Medicine

## 2012-11-28 VITALS — BP 120/80 | Temp 98.3°F

## 2012-11-28 DIAGNOSIS — K5289 Other specified noninfective gastroenteritis and colitis: Secondary | ICD-10-CM

## 2012-11-28 DIAGNOSIS — J209 Acute bronchitis, unspecified: Secondary | ICD-10-CM

## 2012-11-28 DIAGNOSIS — K529 Noninfective gastroenteritis and colitis, unspecified: Secondary | ICD-10-CM

## 2012-11-28 MED ORDER — LORAZEPAM 1 MG PO TABS: 1.0000 mg | ORAL_TABLET | Freq: Three times a day (TID) | ORAL | Status: DC | PRN

## 2012-11-28 MED ORDER — HYDROCODONE-HOMATROPINE 5-1.5 MG/5ML PO SYRP: 5.0000 mL | ORAL_SOLUTION | Freq: Four times a day (QID) | ORAL | Status: AC | PRN

## 2012-11-28 NOTE — Progress Notes (Signed)
  Subjective:    Patient ID: Teresa Wolf, female    DOB: 26-Oct-1965, 47 y.o.   MRN: [id number]  HPI  Acute visit Onset of this past Monday of diarrhea nausea and vomiting. The symptoms are resolving but she still has some mild nausea. Denies any localized abdominal pain. No fever.  Cough mostly dry yesterday. She has some postnasal drip symptoms. No history of asthma. Nonsmoker. Using Delsym cough syrup with mild relief. Still has significant cough at night  Past Medical History  Diagnosis Date  . Cluster headaches     sporadic  . Hypertension   . Depression   . Anxiety   . HLD (hyperlipidemia)   . CAD in native artery     s/p Ant STEMI 10/18/11;  Emergent LHC 10/18/11: LAD occluded with thrombus, circumflex patent, RCA patent, EF 35% with mid anterior akinesis extending into the apex and distal inferior wall.  PCI: Promus DES to the mid LAD.  Follow up echocardiogram 10/18/11: EF 60-65%, mild focal basal septal hypertrophy, severe hypokinesis of the mid to distal anterior septal myocardium, grade 2 diastolic dysfuncti  . SVT (supraventricular tachycardia)     post MI 2/13 converted with adenosine   Past Surgical History  Procedure Laterality Date  . Cesarean section      reports that she has never smoked. She has never used smokeless tobacco. She reports that  drinks alcohol. She reports that she does not use illicit drugs. family history includes Breast cancer in her maternal aunt; Cancer in her maternal grandmother and paternal grandfather; Diabetes in her maternal grandmother; Heart disease in her maternal grandmother; Heart disease (age of onset: 74) in her mother; Hyperlipidemia in her maternal grandmother; and Osteoporosis in her mother. Allergies  Allergen Reactions  . Penicillins Hives     Review of Systems  Constitutional: Negative for fever and chills.  HENT: Positive for congestion.   Respiratory: Positive for cough.   Gastrointestinal: Positive for nausea.  Negative for vomiting.       Objective:   Physical Exam  Constitutional: She appears well-developed and well-nourished.  HENT:  Right Ear: External ear normal.  Left Ear: External ear normal.  Mouth/Throat: Oropharynx is clear and moist.  Neck: Neck supple.  Cardiovascular: Normal rate and regular rhythm.   Pulmonary/Chest: Effort normal and breath sounds normal. No respiratory distress. She has no wheezes. She has no rales.  Lymphadenopathy:    She has no cervical adenopathy.          Assessment & Plan:  #1 probable resolving gastroenteritis. Reassurance #2 acute bronchitis. Probably viral. Hycodan cough syrup 1 teaspoon each bedtime for nighttime symptoms. Followup for any fever or changing symptoms

## 2012-11-28 NOTE — Patient Instructions (Addendum)

## 2012-12-01 ENCOUNTER — Ambulatory Visit (INDEPENDENT_AMBULATORY_CARE_PROVIDER_SITE_OTHER): Payer: BC Managed Care – PPO | Admitting: Emergency Medicine

## 2012-12-01 VITALS — BP 119/82 | HR 96 | Temp 98.0°F | Resp 16 | Ht 61.25 in | Wt 170.2 lb

## 2012-12-01 DIAGNOSIS — J209 Acute bronchitis, unspecified: Secondary | ICD-10-CM

## 2012-12-01 DIAGNOSIS — J042 Acute laryngotracheitis: Secondary | ICD-10-CM

## 2012-12-01 MED ORDER — PROMETHAZINE-CODEINE 6.25-10 MG/5ML PO SYRP: 5.0000 mL | ORAL_SOLUTION | ORAL | Status: DC | PRN

## 2012-12-01 NOTE — Progress Notes (Signed)
[facility] [address] [place] [address] [phone]  Date:  12/01/2012   Name:  Teresa Wolf   DOB:  [birth year]   MRN:  [id number]  PCP:  Kristian Covey, MD    Chief Complaint: Fever and Laryngitis   History of Present Illness:  Teresa Wolf is a 47 y.o. very pleasant female patient who presents with the following:  Ill for a week with nonproductive cough and fever.  No nasal discharge. Has a sore throat.  No nausea or vomiting. No wheezing or shortness of breath. Temp to 100.3.  Saw her FMD who put her on tussionex and told her it was viral.  She did not tolerate the tussionex.  Her mother is insisting we put her on a zpak.   No improvement with over the counter medications or other home remedies. Denies other complaint or health concern today.   Patient Active Problem List  Diagnosis  . Cluster headaches  . Hypertension  . Hyperlipidemia  . SVT (supraventricular tachycardia)  . Anterior STEMI 10/18/2011 treated with Promus DES to the mid LAD  . Coronary Artery Disease  . History of depression    Past Medical History  Diagnosis Date  . Cluster headaches     sporadic  . Hypertension   . Depression   . Anxiety   . HLD (hyperlipidemia)   . CAD in native artery     s/p Ant STEMI 10/18/11;  Emergent LHC 10/18/11: LAD occluded with thrombus, circumflex patent, RCA patent, EF 35% with mid anterior akinesis extending into the apex and distal inferior wall.  PCI: Promus DES to the mid LAD.  Follow up echocardiogram 10/18/11: EF 60-65%, mild focal basal septal hypertrophy, severe hypokinesis of the mid to distal anterior septal myocardium, grade 2 diastolic dysfuncti  . SVT (supraventricular tachycardia)     post MI 2/13 converted with adenosine  . Myocardial infarction     Past Surgical History  Procedure Laterality Date  . Cesarean section    . Stent      History  Substance Use Topics  . Smoking status: Never Smoker   . Smokeless  tobacco: Never Used  . Alcohol Use: Yes     Comment: SOCIAL    Family History  Problem Relation Age of Onset  . Osteoporosis Mother   . Heart disease Mother 42    MI  . Breast cancer Maternal Aunt   . Diabetes Maternal Grandmother   . Hyperlipidemia Maternal Grandmother   . Cancer Maternal Grandmother     colon  . Heart disease Maternal Grandmother     great grandmother  . Cancer Paternal Grandfather     lung  . Asthma Daughter     Allergies  Allergen Reactions  . Penicillins Hives    Medication list has been reviewed and updated.  Current Outpatient Prescriptions on File Prior to Visit  Medication Sig Dispense Refill  . acetaminophen (TYLENOL) 500 MG tablet Take 1,000 mg by mouth every 6 (six) hours as needed.      Marland Kitchen aspirin 81 MG chewable tablet Chew 81 mg by mouth once.       Marland Kitchen atorvastatin (LIPITOR) 40 MG tablet TAKE 1 TABLET EVERY DAY  30 tablet  3  . Calcium Carbonate-Vit D-Min (CALCIUM 1200 PO) Take by mouth.      . famotidine (PEPCID) 20 MG tablet Take 20 mg by mouth daily as needed.      Marland Kitchen lisinopril (PRINIVIL,ZESTRIL) 10 MG  tablet Take 1 tablet (10 mg total) by mouth daily.  30 tablet  11  . LORazepam (ATIVAN) 1 MG tablet Take 1 tablet (1 mg total) by mouth every 8 (eight) hours as needed.  30 tablet  0  . metoprolol (LOPRESSOR) 50 MG tablet TAKE 1 TABLET (50 MG TOTAL) BY MOUTH 2 (TWO) TIMES DAILY.  60 tablet  9  . niacin (NIASPAN) 1000 MG CR tablet TAKE 1 TABLET AT BEDTIME  30 tablet  3  . nitroGLYCERIN (NITROSTAT) 0.4 MG SL tablet Place 0.4 mg under the tongue every 5 (five) minutes as needed. Chest pain Total of 4 given en route      . HYDROcodone-homatropine (HYCODAN) 5-1.5 MG/5ML syrup Take 5 mLs by mouth every 6 (six) hours as needed for cough.  120 mL  0  . [DISCONTINUED] citalopram (CELEXA) 20 MG tablet Take 20 mg by mouth daily.       No current facility-administered medications on file prior to visit.    Review of Systems: As per HPI, otherwise  negative.    Physical Examination: Filed Vitals:   12/01/12 0814  BP: 119/82  Pulse: 96  Temp: 98 F (36.7 C)  Resp: 16   Filed Vitals:   12/01/12 0814  Height: 5' 1.25" (1.556 m)  Weight: 170 lb 3.2 oz (77.202 kg)   Body mass index is 31.89 kg/(m^2). Ideal Body Weight: Weight in (lb) to have BMI = 25: 133.1  GEN: WDWN, NAD, Non-toxic, A & O x 3 HEENT: Atraumatic, Normocephalic. Neck supple. No masses, No LAD.  Hoarse Ears and Nose: No external deformity. CV: RRR, No M/G/R. No JVD. No thrill. No extra heart sounds. PULM: CTA B, no wheezes, crackles, rhonchi. No retractions. No resp. distress. No accessory muscle use. ABD: S, NT, ND, +BS. No rebound. No HSM. EXTR: No c/c/e NEURO Normal gait.  PSYCH: Normally interactive. Conversant. Not depressed or anxious appearing.  Calm demeanor.    Assessment and Plan: Viral laryngotracheitis Bronchitis   Signed,  Phillips Odor, MD

## 2012-12-01 NOTE — Patient Instructions (Addendum)
Laryngitis At the top of your windpipe is your voice box. It is the source of your voice. Inside your voice box are 2 bands of muscles called vocal cords. When you breathe, your vocal cords are relaxed and open so that air can get into the lungs. When you decide to say something, these cords come together and vibrate. The sound from these vibrations goes into your throat and comes out through your mouth as sound. Laryngitis is an inflammation of the vocal cords that causes hoarseness, cough, loss of voice, sore throat, and dry throat. Laryngitis can be temporary (acute) or long-term (chronic). Most cases of acute laryngitis improve with time.Chronic laryngitis lasts for more than 3 weeks. CAUSES Laryngitis can often be related to excessive smoking, talking, or yelling, as well as inhalation of toxic fumes and allergies. Acute laryngitis is usually caused by a viral infection, vocal strain, measles or mumps, or bacterial infections. Chronic laryngitis is usually caused by vocal cord strain, vocal cord injury, postnasal drip, growths on the vocal cords, or acid reflux. SYMPTOMS   Cough.  Sore throat.  Dry throat. RISK FACTORS  Respiratory infections.  Exposure to irritating substances, such as cigarette smoke, excessive amounts of alcohol, stomach acids, and workplace chemicals.  Voice trauma, such as vocal cord injury from shouting or speaking too loud. DIAGNOSIS  Your cargiver will perform a physical exam. During the physical exam, your caregiver will examine your throat. The most common sign of laryngitis is hoarseness. Laryngoscopy may be necessary to confirm the diagnosis of this condition. This procedure allows your caregiver to look into the larynx. HOME CARE INSTRUCTIONS  Drink enough fluids to keep your urine clear or pale yellow.  Rest until you no longer have symptoms or as directed by your caregiver.  Breathe in moist air.  Take all medicine as directed by your  caregiver.  Do not smoke.  Talk as little as possible (this includes whispering).  Write on paper instead of talking until your voice is back to normal.  Follow up with your caregiver if your condition has not improved after 10 days. SEEK MEDICAL CARE IF:   You have trouble breathing.  You cough up blood.  You have persistent fever.  You have increasing pain.  You have difficulty swallowing. MAKE SURE YOU:  Understand these instructions.  Will watch your condition.  Will get help right away if you are not doing well or get worse. Document Released: 08/21/2005 Document Revised: 11/13/2011 Document Reviewed: 10/27/2010 [facility] Patient Information 2013 [place], [place]. Bronchitis Bronchitis is the body's way of reacting to injury and/or infection (inflammation) of the bronchi. Bronchi are the air tubes that extend from the windpipe into the lungs. If the inflammation becomes severe, it may cause shortness of breath. CAUSES  Inflammation may be caused by:  A virus.  Germs (bacteria).  Dust.  Allergens.  Pollutants and many other irritants. The cells lining the bronchial tree are covered with tiny hairs (cilia). These constantly beat upward, away from the lungs, toward the mouth. This keeps the lungs free of pollutants. When these cells become too irritated and are unable to do their job, mucus begins to develop. This causes the characteristic cough of bronchitis. The cough clears the lungs when the cilia are unable to do their job. Without either of these protective mechanisms, the mucus would settle in the lungs. Then you would develop pneumonia. Smoking is a common cause of bronchitis and can contribute to pneumonia. Stopping this habit is the single  most important thing you can do to help yourself. TREATMENT   Your caregiver may prescribe an antibiotic if the cough is caused by bacteria. Also, medicines that open up your airways make it easier to breathe. Your caregiver  may also recommend or prescribe an expectorant. It will loosen the mucus to be coughed up. Only take over-the-counter or prescription medicines for pain, discomfort, or fever as directed by your caregiver.  Removing whatever causes the problem (smoking, for example) is critical to preventing the problem from getting worse.  Cough suppressants may be prescribed for relief of cough symptoms.  Inhaled medicines may be prescribed to help with symptoms now and to help prevent problems from returning.  For those with recurrent (chronic) bronchitis, there may be a need for steroid medicines. SEEK IMMEDIATE MEDICAL CARE IF:   During treatment, you develop more pus-like mucus (purulent sputum).  You have a fever.  Your baby is older than 3 months with a rectal temperature of 102 F (38.9 C) or higher.  Your baby is 25 months old or younger with a rectal temperature of 100.4 F (38 C) or higher.  You become progressively more ill.  You have increased difficulty breathing, wheezing, or shortness of breath. It is necessary to seek immediate medical care if you are elderly or sick from any other disease. MAKE SURE YOU:   Understand these instructions.  Will watch your condition.  Will get help right away if you are not doing well or get worse. Document Released: 08/21/2005 Document Revised: 11/13/2011 Document Reviewed: 06/30/2008 [facility] Patient Information 2013 [place], [place].

## 2013-03-03 ENCOUNTER — Other Ambulatory Visit (HOSPITAL_COMMUNITY): Payer: Self-pay | Admitting: Cardiovascular Disease

## 2013-03-25 ENCOUNTER — Other Ambulatory Visit: Payer: Self-pay | Admitting: Cardiovascular Disease

## 2013-05-26 ENCOUNTER — Telehealth: Payer: Self-pay | Admitting: Cardiovascular Disease

## 2013-05-26 NOTE — Telephone Encounter (Signed)
Marylu Lund from patient's dentist office called because pt is in the chair right NOW & Marylu Lund wants to know if she needs to be medicated before procedure. Pt had stents put in Feb. 2013. Lauren confirmed w/Janet that pt does not need to be medicated.

## 2013-05-26 NOTE — Telephone Encounter (Signed)
I spoke with Teresa Wolf and made her aware that the pt does not require SBE.

## 2013-07-10 ENCOUNTER — Other Ambulatory Visit: Payer: Self-pay

## 2013-08-03 ENCOUNTER — Other Ambulatory Visit: Payer: Self-pay | Admitting: Cardiovascular Disease

## 2013-09-10 ENCOUNTER — Ambulatory Visit (INDEPENDENT_AMBULATORY_CARE_PROVIDER_SITE_OTHER): Payer: BC Managed Care – PPO | Admitting: Cardiovascular Disease

## 2013-09-10 ENCOUNTER — Encounter: Payer: Self-pay | Admitting: Cardiovascular Disease

## 2013-09-10 VITALS — BP 124/78 | HR 100 | Ht 61.25 in | Wt 178.4 lb

## 2013-09-10 DIAGNOSIS — I2510 Atherosclerotic heart disease of native coronary artery without angina pectoris: Secondary | ICD-10-CM

## 2013-09-10 DIAGNOSIS — I10 Essential (primary) hypertension: Secondary | ICD-10-CM

## 2013-09-10 DIAGNOSIS — E785 Hyperlipidemia, unspecified: Secondary | ICD-10-CM

## 2013-09-10 MED ORDER — NITROGLYCERIN 0.4 MG SL SUBL
SUBLINGUAL_TABLET | SUBLINGUAL | Status: DC
Start: 2013-09-10 — End: 2019-04-24

## 2013-09-10 NOTE — Patient Instructions (Addendum)
Your physician wants you to follow-up in: 1 year with Dr. Burt Knack.  You will receive a reminder letter in the mail two months in advance. If you don't receive a letter, please call our office to schedule the follow-up appointment.  Your physician recommends that you continue on your current medications as directed. Please refer to the Current Medication list given to you today.  Your physician recommends that you return for lab work in: When you are fasting (nothing to eat or drink after midnight) Lipid and liver profile

## 2013-09-10 NOTE — Progress Notes (Signed)
HPI:  48 year old woman presenting for followup of coronary artery disease. She presented in February 2013 with an anterior infarct. She had total occlusion of the LAD and was treated with a drug-eluting stent. There was no other significant CAD noted. Her post MI left ventricular ejection fraction is 60-65%. She has tolerated medical therapy well.  Overall she feels good. She denies chest pain, chest pressure, dyspnea, or edema. She had bronchitis last month with a prolonged recovery period. She's feeling better over the past few weeks. She's not been exercising regularly. She does take her medications as directed.   Outpatient Encounter Prescriptions as of 09/10/2013  Medication Sig  . acetaminophen (TYLENOL) 500 MG tablet Take 1,000 mg by mouth every 6 (six) hours as needed.  Marland Kitchen aspirin 81 MG chewable tablet Chew 81 mg by mouth once.   Marland Kitchen atorvastatin (LIPITOR) 40 MG tablet TAKE 1 TABLET BY MOUTH EVERY DAY  . Calcium Carbonate-Vit D-Min (CALCIUM 1200 PO) Take by mouth.  . famotidine (PEPCID) 20 MG tablet Take 20 mg by mouth daily as needed.  Marland Kitchen lisinopril (PRINIVIL,ZESTRIL) 10 MG tablet TAKE 1 TABLET EVERY DAY  . LORazepam (ATIVAN) 1 MG tablet Take 1 tablet (1 mg total) by mouth every 8 (eight) hours as needed.  . metoprolol (LOPRESSOR) 50 MG tablet TAKE 1 TABLET BY MOUTH TWICE A DAY  . niacin (NIASPAN) 1000 MG CR tablet TAKE 1 TABLET BY MOUTH AT BEDTIME  . nitroGLYCERIN (NITROSTAT) 0.4 MG SL tablet Place 0.4 mg under the tongue every 5 (five) minutes as needed. Chest pain Total of 4 given en route  . [DISCONTINUED] metoprolol (LOPRESSOR) 50 MG tablet TAKE 1 TABLET BY MOUTH TWICE A DAY  . [DISCONTINUED] promethazine-codeine (PHENERGAN WITH CODEINE) 6.25-10 MG/5ML syrup Take 5-10 mLs by mouth every 4 (four) hours as needed for cough.    Allergies  Allergen Reactions  . Penicillins Hives    Past Medical History  Diagnosis Date  . Cluster headaches     sporadic  . Hypertension   .  Depression   . Anxiety   . HLD (hyperlipidemia)   . CAD in native artery     s/p Ant STEMI 10/18/11;  Emergent LHC 10/18/11: LAD occluded with thrombus, circumflex patent, RCA patent, EF 35% with mid anterior akinesis extending into the apex and distal inferior wall.  PCI: Promus DES to the mid LAD.  Follow up echocardiogram 10/18/11: EF 60-65%, mild focal basal septal hypertrophy, severe hypokinesis of the mid to distal anterior septal myocardium, grade 2 diastolic dysfuncti  . SVT (supraventricular tachycardia)     post MI 2/13 converted with adenosine  . Myocardial infarction     ROS: Negative except as per HPI  BP 124/78  Pulse 100  Ht 5' 1.25" (1.556 m)  Wt 178 lb 6.4 oz (80.922 kg)  BMI 33.42 kg/m2  PHYSICAL EXAM: Pt is alert and oriented, NAD HEENT: normal Neck: JVP - normal, carotids 2+= without bruits Lungs: CTA bilaterally CV: RRR without murmur or gallop Abd: soft, NT, Positive BS, no hepatomegaly Ext: no C/C/E, distal pulses intact and equal Skin: warm/dry no rash  EKG:  Sinus rhythm 100 beats per minute, possible left atrial enlargement, within normal limits otherwise.  ASSESSMENT AND PLAN: 1. Coronary artery disease, native vessel. She is stable with no symptoms of angina. She will continue on her current medical program which includes aspirin, a statin drug, and a beta blocker.  2. Hypertension. Blood pressure is well controlled on lisinopril and  metoprolol. She has had borderline diastolic blood pressures in the past, but today is the reading is in range.  3. Hyperlipidemia. Last lipids from November 2013 showed a cholesterol of 121, HDL 36, and LDL 71. She's on a combination of atorvastatin and niacin. Will repeat her lab work in the near future. Discussed the importance of weight loss and initiation of an exercise program.  Sherren Mocha 09/10/2013 2:57 PM

## 2013-09-11 ENCOUNTER — Other Ambulatory Visit (INDEPENDENT_AMBULATORY_CARE_PROVIDER_SITE_OTHER): Payer: BC Managed Care – PPO

## 2013-09-11 ENCOUNTER — Ambulatory Visit: Payer: BC Managed Care – PPO | Admitting: Cardiovascular Disease

## 2013-09-11 DIAGNOSIS — E785 Hyperlipidemia, unspecified: Secondary | ICD-10-CM

## 2013-09-11 DIAGNOSIS — I2510 Atherosclerotic heart disease of native coronary artery without angina pectoris: Secondary | ICD-10-CM

## 2013-09-11 DIAGNOSIS — I10 Essential (primary) hypertension: Secondary | ICD-10-CM

## 2013-09-11 LAB — LIPID PANEL
CHOL/HDL RATIO: 3
Cholesterol: 122 mg/dL (ref 0–200)
HDL: 40.6 mg/dL (ref >39.00)
LDL CALC: 69 mg/dL (ref 0–99)
Triglycerides: 62 mg/dL (ref 0.0–149.0)
VLDL: 12.4 mg/dL (ref 0.0–40.0)

## 2013-09-11 LAB — HEPATIC FUNCTION PANEL
ALBUMIN: 4 g/dL (ref 3.5–5.2)
ALT: 15 U/L (ref 0–35)
AST: 18 U/L (ref 0–37)
Alkaline Phosphatase: 68 U/L (ref 39–117)
BILIRUBIN TOTAL: 0.5 mg/dL (ref 0.3–1.2)
Bilirubin, Direct: 0 mg/dL (ref 0.0–0.3)
Total Protein: 7.2 g/dL (ref 6.0–8.3)

## 2013-09-12 ENCOUNTER — Other Ambulatory Visit: Payer: BC Managed Care – PPO

## 2013-10-09 ENCOUNTER — Other Ambulatory Visit: Payer: Self-pay | Admitting: Cardiovascular Disease

## 2013-11-25 ENCOUNTER — Other Ambulatory Visit: Payer: Self-pay | Admitting: Cardiovascular Disease

## 2013-11-26 ENCOUNTER — Other Ambulatory Visit: Payer: Self-pay | Admitting: *Deleted

## 2013-12-04 ENCOUNTER — Other Ambulatory Visit: Payer: Self-pay | Admitting: Family Medicine

## 2013-12-04 DIAGNOSIS — Z1231 Encounter for screening mammogram for malignant neoplasm of breast: Secondary | ICD-10-CM

## 2014-02-12 ENCOUNTER — Inpatient Hospital Stay: Admission: RE | Admit: 2014-02-12 | Payer: BC Managed Care – PPO | Source: Ambulatory Visit

## 2014-02-19 ENCOUNTER — Ambulatory Visit (INDEPENDENT_AMBULATORY_CARE_PROVIDER_SITE_OTHER): Payer: BC Managed Care – PPO | Admitting: Gynecology

## 2014-02-19 ENCOUNTER — Encounter: Payer: Self-pay | Admitting: Gynecology

## 2014-02-19 ENCOUNTER — Other Ambulatory Visit (HOSPITAL_COMMUNITY)
Admission: RE | Admit: 2014-02-19 | Discharge: 2014-02-19 | Disposition: A | Discharge status: Home / Self Care | Payer: BC Managed Care – PPO | Source: Ambulatory Visit | Attending: Gynecology | Admitting: Gynecology

## 2014-02-19 VITALS — BP 126/82 | Ht 61.0 in | Wt 180.0 lb

## 2014-02-19 DIAGNOSIS — N904 Leukoplakia of vulva: Secondary | ICD-10-CM

## 2014-02-19 DIAGNOSIS — Z23 Encounter for immunization: Secondary | ICD-10-CM

## 2014-02-19 DIAGNOSIS — Z01419 Encounter for gynecological examination (general) (routine) without abnormal findings: Secondary | ICD-10-CM

## 2014-02-19 DIAGNOSIS — Z1151 Encounter for screening for human papillomavirus (HPV): Secondary | ICD-10-CM | POA: Insufficient documentation

## 2014-02-19 NOTE — Addendum Note (Signed)
Addended by: Thurnell Garbe A on: 02/19/2014 04:21 PM   Modules accepted: Orders

## 2014-02-19 NOTE — Progress Notes (Signed)
Teresa Wolf 04-15-1966 [id number]   History:    48 y.o.  for annual gyn exam who has not been seen in the office is 2013. She had informed you shortly after that office visit she suffered a myocardial infarction and has been followed by the cardiologist. She is being treated by Dr. Burt Knack for her hypertension and hyperlipidemia. She also has history of SVT. Patient's partner has a vasectomy. Patient's abnormal menstrual cycles. Patient scheduling her first mammogram in the next few weeks. The patient with no prior history of abnormal Pap smear.  Past medical history,surgical history, family history and social history were all reviewed and documented in the EPIC chart.  Gynecologic History Patient's last menstrual period was 12/20/2013. Contraception: vasectomy Last Pap: 2013. Results were: normal Last mammogram: No prior study. Results were: No prior study  Obstetric History OB History  Gravida Para Term Preterm AB SAB TAB Ectopic Multiple Living  1 1  1      1     # Outcome Date GA Lbr Len/2nd Weight Sex Delivery Anes PTL Lv  1 PRE     F CS  Y Y       ROS: A ROS was performed and pertinent positives and negatives are included in the history.  GENERAL: No fevers or chills. HEENT: No change in vision, no earache, sore throat or sinus congestion. NECK: No pain or stiffness. CARDIOVASCULAR: No chest pain or pressure. No palpitations. PULMONARY: No shortness of breath, cough or wheeze. GASTROINTESTINAL: No abdominal pain, nausea, vomiting or diarrhea, melena or bright red blood per rectum. GENITOURINARY: No urinary frequency, urgency, hesitancy or dysuria. MUSCULOSKELETAL: No joint or muscle pain, no back pain, no recent trauma. DERMATOLOGIC: No rash, no itching, no lesions. ENDOCRINE: No polyuria, polydipsia, no heat or cold intolerance. No recent change in weight. HEMATOLOGICAL: No anemia or easy bruising or bleeding. NEUROLOGIC: No headache, seizures, numbness, tingling or weakness.  PSYCHIATRIC: No depression, no loss of interest in normal activity or change in sleep pattern.     Exam: chaperone present  BP 126/82  Ht 5\' 1"  (1.549 m)  Wt 180 lb (81.647 kg)  BMI 34.03 kg/m2  LMP 12/20/2013  Body mass index is 34.03 kg/(m^2).  General appearance : Well developed well nourished female. No acute distress HEENT: Neck supple, trachea midline, no carotid bruits, no thyroidmegaly Lungs: Clear to auscultation, no rhonchi or wheezes, or rib retractions  Heart: Regular rate and rhythm, no murmurs or gallops Breast:Examined in sitting and supine position were symmetrical in appearance, no palpable masses or tenderness,  no skin retraction, no nipple inversion, no nipple discharge, no skin discoloration, no axillary or supraclavicular lymphadenopathy Abdomen: no palpable masses or tenderness, no rebound or guarding Extremities: no edema or skin discoloration or tenderness  Pelvic: Patient would leukoplakic area of the fourchette  Bartholin, Urethra, Skene Glands: Within normal limits             Vagina: No gross lesions or discharge  Cervix: No gross lesions or discharge  Uterus  anteverted, normal size, shape and consistency, non-tender and mobile  Adnexa  Without masses or tenderness  Anus and perineum  normal   Rectovaginal  normal sphincter tone without palpated masses or tenderness             Hemoccult not indicated     Assessment/Plan:  48 y.o. female for annual exam would leukoplakic area of the fourchette. A Pap smear was done today. She will return back to the  office for a colposcopy evaluation and biopsy. 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 Literature and information was provided. Patient to receive her Tdap vaccine today. Blood work will be drawn by her PCP. Patient was reminded schedule her mammogram. And we discussed importance of monthly self breast exams.  Note: This dictation was prepared with  Dragon/digital dictation along withSmart phrase technology.  Any transcriptional errors that result from this process are unintentional.   Terrance Mass MD, 4:08 PM 02/19/2014

## 2014-02-19 NOTE — Patient Instructions (Addendum)
Tetanus, Diphtheria (Td) Vaccine What You Need to Know WHY GET VACCINATED? Tetanus  and diphtheria are very serious diseases. They are rare in the [place] today, but people who do become infected often have severe complications. Td vaccine is used to protect adolescents and adults from both of these diseases. Both tetanus and diphtheria are infections caused by bacteria. Diphtheria spreads from person to person through coughing or sneezing. Tetanus-causing bacteria enter the body through cuts, scratches, or wounds. TETANUS (Lockjaw) causes painful muscle tightening and stiffness, usually all over the body.  It can lead to tightening of muscles in the head and neck so you can't open your mouth, swallow, or sometimes even breathe. Tetanus kills about 1 out of every 5 people who are infected. DIPHTHERIA can cause a thick coating to form in the back of the throat.  It can lead to breathing problems, paralysis, heart failure, and death. Before vaccines, the [place] saw as many as 200,000 cases a year of diphtheria and hundreds of cases of tetanus. Since vaccination began, cases of both diseases have dropped by about 99%. TD VACCINE Td vaccine can protect adolescents and adults from tetanus and diphtheria. Td is usually given as a booster dose every 10 years but it can also be given earlier after a severe and dirty wound or burn. Your doctor can give you more information. Td may safely be given at the same time as other vaccines. SOME PEOPLE SHOULD NOT GET THIS VACCINE  If you ever had a life-threatening allergic reaction after a dose of any tetanus or diphtheria containing vaccine, OR if you have a severe allergy to any part of this vaccine, you should not get Td. Tell your doctor if you have any severe allergies.  Talk to your doctor if you:  have epilepsy or another nervous system problem,  had severe pain or swelling after any vaccine containing diphtheria or tetanus,  ever had  Guillain Barr Syndrome (GBS),  aren't feeling well on the day the shot is scheduled. RISKS OF A VACCINE REACTION With a vaccine, like any medicine, there is a chance of side effects. These are usually mild and go away on their own. Serious side effects are also possible, but are very rare. Most people who get Td vaccine do not have any problems with it. Mild Problems  following Td (Did not interfere with activities)  Pain where the shot was given (about 8 people in 10)  Redness or swelling where the shot was given (about 1 person in 3)  Mild fever (about 1 person in 15)  Headache or Tiredness (uncommon) Moderate Problems following Td (Interfered with activities, but did not require medical attention)  Fever over 102 F (38.9 C) (rare) Severe Problems  following Td (Unable to perform usual activities; required medical attention)  Swelling, severe pain, bleeding, or redness in the arm where the shot was given (rare). Problems that could happen after any vaccine:  Brief fainting spells can happen after any medical procedure, including vaccination. Sitting or lying down for about 15 minutes can help prevent fainting, and injuries caused by a fall. Tell your doctor if you feel dizzy, or have vision changes or ringing in the ears.  Severe shoulder pain and reduced range of motion in the arm where a shot was given can happen, very rarely, after a vaccination.  Severe allergic reactions from a vaccine are very rare, estimated at less than 1 in a million doses. If one were to occur, it would   usually be within a few minutes to a few hours after the vaccination. WHAT IF THERE IS A SERIOUS REACTION? What should I look for?  Look for anything that concerns you, such as signs of a severe allergic reaction, very high fever, or behavior changes. Signs of a severe allergic reaction can include hives, swelling of the face and throat, difficulty breathing, a fast heartbeat, dizziness, and  weakness. These would usually start a few minutes to a few hours after the vaccination. What should I do?  If you think it is a severe allergic reaction or other emergency that can't wait, call 911 or get the person to the nearest hospital. Otherwise, call your doctor.  Afterward, the reaction should be reported to the Vaccine Adverse Event Reporting System (VAERS). Your doctor might file this report, or, you can do it yourself through the VAERS website or by calling ([phone] [phone]. VAERS is only for reporting reactions. They do not give medical advice. THE NATIONAL VACCINE INJURY COMPENSATION PROGRAM The National Vaccine Injury Compensation Program (VICP) is a federal program that was created to compensate people who may have been injured by certain vaccines. Persons who believe they may have been injured by a vaccine can learn about the program and about filing a claim by calling 828-401-6578 or visiting the [facility] website. HOW CAN I LEARN MORE?  Ask your doctor.  Contact your local or state health department.  Contact the Centers for Disease Control and Prevention (CDC):  Call (602) 284-8922 (1-800-CDC-INFO)  Visit CDC's vaccines website CDC Td Vaccine Interim VIS (10/08/12) Document Released: 06/18/2006 Document Revised: 12/16/2012 Document Reviewed: 12/11/2012 [facility] Patient Information 2015 [place], [place]. This information is not intended to replace advice given to you by your health care provider. Make sure you discuss any questions you have with your health care provider. Lichen Sclerosus Lichen sclerosus is a skin problem. It can happen on any part of the body, but it commonly involves the anal or genital areas. Lichen sclerosus is not an infection or a fungus. Girls and women are more commonly affected than boys and men. CAUSES The cause is not known. It could be the result of an overactive immune system or a lack of certain hormones. Lichen sclerosus is not passed from one  person to another (not contagious). SYMPTOMS Your skin may have:  Thin, wrinkled, white areas.  Thickened white areas.  Red and swollen patches.  Tears or cracks.  Bruising.  Blood blisters.  Severe itching. You may also have pain, itching, or burning with urination. Constipation is also common in people with lichen sclerosus. DIAGNOSIS Your caregiver will do a physical exam. Sometimes, a tissue sample (biopsy) may be sent for testing. TREATMENT Treatment may involve putting a thin layer of medicated cream (topical steroid) over the areas with lichen sclerosus. Use the cream only as directed by your caregiver.  HOME CARE INSTRUCTIONS  Only take over-the-counter or prescription medicines as directed by your caregiver.  Keep the vaginal area as clean and dry as possible. SEEK MEDICAL CARE IF: You develop increasing pain, swelling, or redness. Document Released: 01/11/2011 Document Revised: 11/13/2011 Document Reviewed: 01/11/2011 [facility] Patient Information 2015 [place], [place]. This information is not intended to replace advice given to you by your health care provider. Make sure you discuss any questions you have with your health care provider.

## 2014-02-19 NOTE — Addendum Note (Signed)
Addended by: Thurnell Garbe A on: 02/19/2014 04:23 PM   Modules accepted: Orders

## 2014-02-23 LAB — CYTOLOGY - PAP

## 2014-03-02 ENCOUNTER — Other Ambulatory Visit: Payer: Self-pay | Admitting: Cardiovascular Disease

## 2014-03-17 ENCOUNTER — Encounter: Payer: Self-pay | Admitting: Gynecology

## 2014-03-17 ENCOUNTER — Ambulatory Visit (INDEPENDENT_AMBULATORY_CARE_PROVIDER_SITE_OTHER): Payer: BC Managed Care – PPO | Admitting: Gynecology

## 2014-03-17 VITALS — BP 126/80

## 2014-03-17 DIAGNOSIS — N9089 Other specified noninflammatory disorders of vulva and perineum: Secondary | ICD-10-CM

## 2014-03-17 NOTE — Patient Instructions (Signed)
Lichen Sclerosus Lichen sclerosus is a skin problem. It can happen on any part of the body, but it commonly involves the anal or genital areas. Lichen sclerosus is not an infection or a fungus. Girls and women are more commonly affected than boys and men. CAUSES The cause is not known. It could be the result of an overactive immune system or a lack of certain hormones. Lichen sclerosus is not passed from one person to another (not contagious). SYMPTOMS Your skin may have:  Thin, wrinkled, white areas.  Thickened white areas.  Red and swollen patches.  Tears or cracks.  Bruising.  Blood blisters.  Severe itching. You may also have pain, itching, or burning with urination. Constipation is also common in people with lichen sclerosus. DIAGNOSIS Your caregiver will do a physical exam. Sometimes, a tissue sample (biopsy) may be sent for testing. TREATMENT Treatment may involve putting a thin layer of medicated cream (topical steroid) over the areas with lichen sclerosus. Use the cream only as directed by your caregiver.  HOME CARE INSTRUCTIONS  Only take over-the-counter or prescription medicines as directed by your caregiver.  Keep the vaginal area as clean and dry as possible. SEEK MEDICAL CARE IF: You develop increasing pain, swelling, or redness. Document Released: 01/11/2011 Document Revised: 11/13/2011 Document Reviewed: 01/11/2011 [employer] Patient Information 2015 [employer]. This information is not intended to replace advice given to you by your health care provider. Make sure you discuss any questions you have with your health care provider.  

## 2014-03-17 NOTE — Progress Notes (Signed)
   Patient presents to the office today for colposcopic evaluation of her external genitalia due to the fact that at time of her annual exam on June 18 and incidental finding of a leukoplakic area at the area of the fourchette was noted. Patient states that she has never had any abnormal Pap smears in the past. Pap smear done here in our office June 18 was normal.  Colposcopic evaluation today:   Physical Exam  Genitourinary:      patient underwent detail colposcopic evaluation and of the external genitalia perineum and perirectal region. The only abnormality was noted was a leukoplakic area at the area of the fourchette. Speculum exam of the vagina and cervix did not demonstrate any lesions. The area of the fourchette was cleansed with Betadine solution and 1% lidocaine was infiltrated subdermally and a keypunch biopsy was obtained and the tissue submitted for histologic evaluation. Silver nitrate was used for hemostasis.  Assessment/plan: Leukoplakic area the fourchette highly suspicious for lichen sclerosis will await pathology report.

## 2014-03-18 ENCOUNTER — Other Ambulatory Visit: Payer: Self-pay | Admitting: Gynecology

## 2014-03-18 ENCOUNTER — Other Ambulatory Visit: Payer: Self-pay | Admitting: Cardiovascular Disease

## 2014-03-18 MED ORDER — CLOBETASOL PROPIONATE 0.05 % EX CREA: 1.0000 "application " | TOPICAL_CREAM | Freq: Two times a day (BID) | CUTANEOUS | Status: DC

## 2014-04-02 ENCOUNTER — Ambulatory Visit: Payer: BC Managed Care – PPO | Admitting: Gynecology

## 2014-05-11 ENCOUNTER — Other Ambulatory Visit: Payer: Self-pay | Admitting: Cardiovascular Disease

## 2014-05-12 ENCOUNTER — Other Ambulatory Visit: Payer: Self-pay | Admitting: Cardiovascular Disease

## 2014-07-06 ENCOUNTER — Encounter: Payer: Self-pay | Admitting: Gynecology

## 2014-08-13 ENCOUNTER — Encounter (HOSPITAL_COMMUNITY): Payer: Self-pay | Admitting: Cardiovascular Disease

## 2014-09-30 ENCOUNTER — Ambulatory Visit (INDEPENDENT_AMBULATORY_CARE_PROVIDER_SITE_OTHER): Payer: BC Managed Care – PPO | Admitting: Cardiovascular Disease

## 2014-09-30 ENCOUNTER — Encounter: Payer: Self-pay | Admitting: Cardiovascular Disease

## 2014-09-30 VITALS — BP 112/80 | HR 94 | Ht 61.0 in | Wt 191.0 lb

## 2014-09-30 DIAGNOSIS — E785 Hyperlipidemia, unspecified: Secondary | ICD-10-CM

## 2014-09-30 DIAGNOSIS — I10 Essential (primary) hypertension: Secondary | ICD-10-CM

## 2014-09-30 DIAGNOSIS — I2510 Atherosclerotic heart disease of native coronary artery without angina pectoris: Secondary | ICD-10-CM

## 2014-09-30 NOTE — Progress Notes (Signed)
Cardiology Office Note   Date:  09/30/2014   ID:  [name], DOB 04/19/1966, MRN [id number]  PCP:  Florina Ou, MD  Cardiologist:  Sherren Mocha, MD    No chief complaint on file.    History of Present Illness: Teresa Wolf is a 49 y.o. female who presents for followup of coronary artery disease. She presented in February 2013 with an anterior infarct. She had total occlusion of the LAD and was treated with a drug-eluting stent. There was no other significant CAD noted. Her post MI left ventricular ejection fraction is 60-65%. She has tolerated medical therapy well.  She has rare palpitations every few months. She is able to terminate these with Valsalva maneuvers. Primarily occur when she firsts lays down to bed. Otherwise she is doing quite well and denies chest pain or pressure. No shortness of breath, edema, orthopnea, or PND. She recently joined [employer] and has started an exercise routine. Notes she's been under a lot of stress and palpitations are worse with stress and with caffeine.     Past Medical History  Diagnosis Date  . Cluster headaches     sporadic  . Hypertension   . Depression   . Anxiety   . HLD (hyperlipidemia)   . CAD in native artery     s/p Ant STEMI 10/18/11;  Emergent LHC 10/18/11: LAD occluded with thrombus, circumflex patent, RCA patent, EF 35% with mid anterior akinesis extending into the apex and distal inferior wall.  PCI: Promus DES to the mid LAD.  Follow up echocardiogram 10/18/11: EF 60-65%, mild focal basal septal hypertrophy, severe hypokinesis of the mid to distal anterior septal myocardium, grade 2 diastolic dysfuncti  . SVT (supraventricular tachycardia)     post MI 2/13 converted with adenosine  . Myocardial infarction     Past Surgical History  Procedure Laterality Date  . Cesarean section    . Stent    . Left heart catheterization with coronary angiogram N/A 10/18/2011    Procedure: LEFT HEART CATHETERIZATION WITH CORONARY  ANGIOGRAM;  Surgeon: Sherren Mocha, MD;  Location: [facility] CATH LAB;  Service: Cardiovascular;  Laterality: N/A;  . Percutaneous coronary stent intervention (pci-s)  10/18/2011    Procedure: PERCUTANEOUS CORONARY STENT INTERVENTION (PCI-S);  Surgeon: Sherren Mocha, MD;  Location[facility] CATH LAB;  Service: Cardiovascular;;    Current Outpatient Prescriptions  Medication Sig Dispense Refill  . acetaminophen (TYLENOL) 500 MG tablet Take 1,000 mg by mouth every 6 (six) hours as needed.    Marland Kitchen aspirin 81 MG chewable tablet Chew 81 mg by mouth once.     Marland Kitchen atorvastatin (LIPITOR) 40 MG tablet TAKE 1 TABLET BY MOUTH EVERY DAY 30 tablet 9  . Calcium Carbonate-Vit D-Min (CALCIUM 1200 PO) Take by mouth.    . citalopram (CELEXA) 20 MG tablet Take 20 mg by mouth daily.   6  . clobetasol cream (TEMOVATE) 0.86 % Apply 1 application topically 2 (two) times daily. 30 g 0  . famotidine (PEPCID) 20 MG tablet Take 20 mg by mouth daily as needed.    Marland Kitchen lisinopril (PRINIVIL,ZESTRIL) 10 MG tablet TAKE 1 TABLET EVERY DAY 30 tablet 9  . LORazepam (ATIVAN) 1 MG tablet Take 1 tablet (1 mg total) by mouth every 8 (eight) hours as needed. 30 tablet 0  . metoprolol (LOPRESSOR) 50 MG tablet TAKE 1 TABLET BY MOUTH TWICE A DAY 60 tablet 5  . niacin (NIASPAN) 1000 MG CR tablet TAKE 1 TABLET BY MOUTH AT  BEDTIME 30 tablet 9  . nitroGLYCERIN (NITROSTAT) 0.4 MG SL tablet Place 0.4 mg under the tongue every 5 minutes as needed for chest pain (do not exceed 3 tabs) 20 tablet 4   No current facility-administered medications for this visit.    Allergies:   Penicillins   Social History:  The patient  reports that she has never smoked. She has never used smokeless tobacco. She reports that she drinks alcohol. She reports that she does not use illicit drugs.   Family History:  The patient's  family history includes Asthma in her daughter; Breast cancer in her maternal aunt; Cancer in her maternal grandmother and paternal grandfather; Diabetes  in her maternal grandmother; Heart disease in her maternal grandmother; Heart disease (age of onset: 59) in her mother; Hyperlipidemia in her maternal grandmother; Osteoporosis in her mother.    ROS:  Please see the history of present illness.  Otherwise, review of systems is positive for depression, easy bruising, anxiety.  All other systems are reviewed and negative.    PHYSICAL EXAM: VS:  BP 112/80 mmHg  Pulse 94  Ht 5\' 1"  (1.549 m)  Wt 169 lb (76.658 kg)  BMI 31.95 kg/m2 , BMI Body mass index is 31.95 kg/(m^2). GEN: Well nourished, well developed, in no acute distress HEENT: normal Neck: no JVD, carotid bruits, or masses Cardiac: RRR with a soft systolic ejection murmur at the left sternal border. Respiratory:  clear to auscultation bilaterally, normal work of breathing GI: soft, nontender, nondistended, + BS MS: no deformity or atrophy Skin: warm and dry, no rash Neuro:  Strength and sensation are intact Psych: euthymic mood, full affect  EKG:  EKG is ordered today. The ekg ordered today shows normal sinus rhythm 94 bpm, age-indeterminate septal infarct.  Recent Labs: No results found for requested labs within last 365 days.   Lipid Panel     Component Value Date/Time   CHOL 122 09/11/2013 0800   TRIG 62.0 09/11/2013 0800   HDL 40.60 09/11/2013 0800   CHOLHDL 3 09/11/2013 0800   VLDL 12.4 09/11/2013 0800   LDLCALC 69 09/11/2013 0800      Wt Readings from Last 3 Encounters:  09/30/14 169 lb (76.658 kg)  02/19/14 180 lb (81.647 kg)  09/10/13 178 lb 6.4 oz (80.922 kg)      ASSESSMENT AND PLAN: 1.  CAD, native vessel, without symptoms of angina. The patient is having no symptoms and she is doing well on her current medical program. No changes were made today. I will see her back in one year. We had a lengthy discussion about diet and lifestyle modification and she is working on this. She is going to the gym and is going to try to be more regular with this.  2.  Essential hypertension: Blood pressure is under ideal control on a combination of metoprolol and lisinopril.  3. Hyperlipidemia: Lipids reviewed from one year ago as above. LDL at that time was 69 mg/dL. She continues treatment with atorvastatin and niacin. Will repeat labs as she is due for updated lipids and LFTs.   Current medicines are reviewed with the patient today.  The patient does not have concerns regarding medicines.  The following changes have been made:  no change  Labs/ tests ordered today include: Lipids and LFTs  No orders of the defined types were placed in this encounter.    Disposition:   FU with me in one year  Signed, Sherren Mocha, MD  09/30/2014 2:22 PM  [facility] [address], [place], [place]  [address] Phone: [phone]; Fax: [phone]

## 2014-09-30 NOTE — Patient Instructions (Signed)
Your physician recommends that you return for a FASTING LIPID and LIVER on 10/05/14--nothing to eat or drink after midnight, lab opens at 7:30 AM.  Your physician wants you to follow-up in: 1 YEAR with Dr Burt Knack.  You will receive a reminder letter in the mail two months in advance. If you don't receive a letter, please call our office to schedule the follow-up appointment.  Your physician recommends that you continue on your current medications as directed. Please refer to the Current Medication list given to you today.

## 2014-10-05 ENCOUNTER — Other Ambulatory Visit: Payer: BC Managed Care – PPO

## 2014-10-07 ENCOUNTER — Other Ambulatory Visit: Payer: Self-pay | Admitting: Cardiovascular Disease

## 2014-11-30 ENCOUNTER — Other Ambulatory Visit: Payer: Self-pay | Admitting: Cardiovascular Disease

## 2014-12-01 ENCOUNTER — Other Ambulatory Visit: Payer: Self-pay | Admitting: Cardiovascular Disease

## 2015-06-20 ENCOUNTER — Other Ambulatory Visit: Payer: Self-pay | Admitting: Cardiovascular Disease

## 2015-07-12 ENCOUNTER — Other Ambulatory Visit: Payer: Self-pay | Admitting: Cardiovascular Disease

## 2016-03-15 ENCOUNTER — Other Ambulatory Visit: Payer: Self-pay | Admitting: Sports Medicine

## 2016-03-15 DIAGNOSIS — M25561 Pain in right knee: Secondary | ICD-10-CM

## 2016-03-23 ENCOUNTER — Ambulatory Visit
Admission: RE | Admit: 2016-03-23 | Discharge: 2016-03-23 | Disposition: A | Discharge status: Home / Self Care | Payer: BC Managed Care – PPO | Source: Ambulatory Visit | Attending: Sports Medicine | Admitting: Sports Medicine

## 2016-03-23 DIAGNOSIS — M25561 Pain in right knee: Secondary | ICD-10-CM

## 2016-04-26 ENCOUNTER — Encounter: Payer: Self-pay | Admitting: Physician Assistant

## 2016-04-26 ENCOUNTER — Ambulatory Visit (INDEPENDENT_AMBULATORY_CARE_PROVIDER_SITE_OTHER): Payer: BC Managed Care – PPO | Admitting: Physician Assistant

## 2016-04-26 VITALS — BP 122/80 | HR 77 | Ht 61.0 in | Wt 198.0 lb

## 2016-04-26 DIAGNOSIS — I2510 Atherosclerotic heart disease of native coronary artery without angina pectoris: Secondary | ICD-10-CM | POA: Diagnosis not present

## 2016-04-26 DIAGNOSIS — Z01818 Encounter for other preprocedural examination: Secondary | ICD-10-CM

## 2016-04-26 DIAGNOSIS — I10 Essential (primary) hypertension: Secondary | ICD-10-CM

## 2016-04-26 DIAGNOSIS — E785 Hyperlipidemia, unspecified: Secondary | ICD-10-CM

## 2016-04-26 DIAGNOSIS — R002 Palpitations: Secondary | ICD-10-CM

## 2016-04-26 NOTE — Patient Instructions (Signed)
Medication Instructions: Your physician recommends that you continue on your current medications as directed. Please refer to the Current Medication list given to you today.   Labwork: None Ordered  Procedures/Testing: None Ordered  Follow-Up: Your physician wants you to follow-up in 6 MONTHS with Dr. Burt Knack. You will receive a reminder letter in the mail two months in advance. If you don't receive a letter, please call our office to schedule the follow-up appointment.   Any Additional Special Instructions Will Be Listed Below (If Applicable).  Deloris Ping deemed necessary, you may hold your Aspirin for you procedure. Please consult with surgeon.Marland Kitchen    If you need a refill on your cardiac medications before your next appointment, please call your pharmacy.

## 2016-04-26 NOTE — Progress Notes (Signed)
Cardiology Office Note    Date:  04/26/2016   ID:  Teresa Wolf, DOB [birth year], MRN [id number]  PCP:  Wende Neighbors, MD  Cardiologist:  Dr. Burt Knack  Chief Complaint: Cardiac clearance for right knee scope, debridement  History of Present Illness:   Teresa Wolf is a 50 y.o. female CAD s/p mid LAD DES, HTN, HLD, and SVT who presented for cardiac clearance.   She presented in February 2013 with an anterior infarct. She had total occlusion of the LAD and was treated with a drug-eluting stent. There was no other significant CAD noted. Her post MI left ventricular ejection fraction is 60-65%. She has tolerated medical therapy well.  She has rare palpitations every few months.  Palpitations are worse with stress and with caffeine. She is able to terminate these with Valsalva maneuvers. Primarily occur when she firsts lays down to bed. The patient was doing well on cardiac stand point when last seen by Dr. Burt Knack 09/2014.  The patient presented today for cardiac clearance. She has seen by Dr. Esmond Plants and pending right knee scope, debridement for tear meniscus May 2017. She was going to gym 3 times/weeks before this. She denies any exertional chest pain or sob. Her palpitations occurs once or twice/months and resolves with Valsalva maneuvers. Now its been less intense. The patient denies nausea, vomiting, fever, chest pain, shortness of breath, orthopnea, PND, dizziness, syncope, cough, congestion, abdominal pain, hematochezia, melena, lower extremity edema.   Past Medical History:  Diagnosis Date  . Anxiety   . CAD in native artery    s/p Ant STEMI 10/18/11;  Emergent LHC 10/18/11: LAD occluded with thrombus, circumflex patent, RCA patent, EF 35% with mid anterior akinesis extending into the apex and distal inferior wall.  PCI: Promus DES to the mid LAD.  Follow up echocardiogram 10/18/11: EF 60-65%, mild focal basal septal hypertrophy, severe hypokinesis of the mid to distal anterior septal  myocardium, grade 2 diastolic dysfuncti  . Cluster headaches    sporadic  . Depression   . HLD (hyperlipidemia)   . Hypertension   . Myocardial infarction ([place])   . SVT (supraventricular tachycardia) ([place])    post MI 2/13 converted with adenosine    Past Surgical History:  Procedure Laterality Date  . CESAREAN SECTION    . LEFT HEART CATHETERIZATION WITH CORONARY ANGIOGRAM N/A 10/18/2011   Procedure: LEFT HEART CATHETERIZATION WITH CORONARY ANGIOGRAM;  Surgeon: Sherren Mocha, MD;  Location: [facility] C[facility] LAB;  Service: Cardiovascular;  Laterality: N/A;  . PERCUTANEOUS CORONARY STENT INTERVENTION (PCI-S)  10/18/2011   Procedure: PERCUTANEOUS CORONARY STENT INTERVENTION (PCI-S);  Surgeon: Sherren Mocha, MD;  Location: [facility] CATH LAB;  Service: Cardiovascular;;  . stent      Current Medications: Prior to Admission medications   Medication Sig Start Date End Date Taking? Authorizing Provider  acetaminophen (TYLENOL) 500 MG tablet Take 1,000 mg by mouth every 6 (six) hours as needed.    Historical Provider, MD  aspirin 81 MG chewable tablet Chew 81 mg by mouth once.     Historical Provider, MD  atorvastatin (LIPITOR) 40 MG tablet TAKE 1 TABLET BY MOUTH EVERY DAY 12/02/14   Sherren Mocha, MD  Calcium Carbonate-Vit D-Min (CALCIUM 1200 PO) Take by mouth.    Historical Provider, MD  citalopram (CELEXA) 20 MG tablet Take 20 mg by mouth daily.  08/13/14   Historical Provider, MD  clobetasol cream (TEMOVATE) [id number] % Apply 1 application topically 2 (two) times daily. 03/18/14   Elita Quick  Linna Caprice, MD  famotidine (PEPCID) 20 MG tablet Take 20 mg by mouth daily as needed.    Historical Provider, MD  lisinopril (PRINIVIL,ZESTRIL) 10 MG tablet TAKE 1 TABLET EVERY DAY 12/02/14   Sherren Mocha, MD  LORazepam (ATIVAN) 1 MG tablet Take 1 tablet (1 mg total) by mouth every 8 (eight) hours as needed. 11/28/12   Eulas Post, MD  metoprolol (LOPRESSOR) 50 MG tablet TAKE 1 TABLET BY MOUTH TWICE A DAY 06/21/15    Sherren Mocha, MD  niacin (NIASPAN) 1000 MG CR tablet TAKE 1 TABLET BY MOUTH AT BEDTIME 12/02/14   Sherren Mocha, MD  nitroGLYCERIN (NITROSTAT) 0.4 MG SL tablet Place 0.4 mg under the tongue every 5 minutes as needed for chest pain (do not exceed 3 tabs) 09/10/13   Sherren Mocha, MD    Allergies:   Penicillins   Social History   Social History  . Marital status: Single    Spouse name: N/A  . Number of children: 1  . Years of education: N/A   Occupational History  . 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 Smoking status: Never Smoker  . Smokeless tobacco: Never Used  . Alcohol use Yes     Comment: SOCIAL  . Drug use: No  . Sexual activity: Yes   Other Topics Concern  . None   Social History Narrative  . None     Family History:  The patient's family history includes Asthma in her daughter; Breast cancer in her maternal aunt; Cancer in her maternal grandmother and paternal grandfather; Diabetes in her maternal grandmother; Heart attack in her mother; Heart disease in her maternal grandmother; Heart disease (age of onset: 25) in her mother; Hyperlipidemia in her maternal grandmother; Osteoporosis in her mother.   ROS:   Please see the history of present illness.    ROS All other systems reviewed and are negative.   PHYSICAL EXAM:   VS:  BP 122/80   Pulse 77   Ht 5\' 1"  (1.549 m)   Wt 198 lb (89.8 kg)   BMI 37.41 kg/m    GEN: Well nourished, well developed, in no acute distress  HEENT: normal  Neck: no JVD, carotid bruits, or masses Cardiac: RRR; no murmurs, rubs, or gallops,no edema  Respiratory:  clear to auscultation bilaterally, normal work of breathing GI: soft, nontender, nondistended, + BS MS: no deformity or atrophy  Skin: warm and dry, no rash Neuro:  Alert and Oriented x 3, Strength and sensation are intact Psych: euthymic mood, full affect  Wt Readings from Last 3 Encounters:  04/26/16 198 lb (89.8 kg)  09/30/14 191 lb (86.6  kg)  02/19/14 180 lb (81.6 kg)      Studies/Labs Reviewed:   EKG:  EKG is ordered today.  The ekg ordered today demonstrates NSR at rate of 77 bpm.    Lipid Panel    Component Value Date/Time   CHOL 122 09/11/2013 0800   TRIG 62.0 09/11/2013 0800   HDL 40.60 09/11/2013 0800   CHOLHDL 3 09/11/2013 0800   VLDL 12.4 09/11/2013 0800   LDLCALC 69 09/11/2013 0800   LABS from PCP office: 11/01/2015  Scr 0.7 K 4.5 Na 137 AST 19 ALT 17 Hgb 13.1 LDL 120 Triglyceride 163 HDL 40 TSH 2.59 Free T4 1.0   Additional studies/ records that were reviewed today include:   Echocardiogram: 10/2011 LV EF: 60% -  65%  ------------------------------------------------------------ Indications:   Supraventricular tachycardia 427.0.  ------------------------------------------------------------  History:  Risk factors: Hypertension.  ------------------------------------------------------------ Study Conclusions  - Left ventricle: The cavity size was normal. There was mild focal basal hypertrophy of the septum. Systolic function was normal. The estimated ejection fraction was in the range of 60% to 65%. There is severe hypokinesis of the mid-distalanteroseptal myocardium. Features are consistent with a pseudonormal left ventricular filling pattern, with concomitant abnormal relaxation and increased filling pressure (grade 2 diastolic dysfunction). - Mitral valve: Mild to moderate regurgitation. - Atrial septum: No defect or patent foramen ovale was identified.  Cardiac Catheterization: 10/2011 [facility] FINDINGS Hemodynamics: AO 163/106 LV 161/27  Coronary angiography: Coronary dominance: right  Left mainstem: Widely patent with an acentric orifice, but the diarrhea flux and no pressure damping. I don't think there is any significant left main stenosis.  Left anterior descending (LAD): The LAD is widely patent in the proximal aspect. Just after the first  diagonal branch, the vessel is totally occluded with appearance of thrombus. There is TIMI 0 flow.  Left circumflex (LCx): The left circumflex is widely patent. There is no significant obstructive disease. The vessel is large and smooth throughout its course. There are 2 large obtuse marginal branches with no significant stenosis.  Right coronary artery (RCA): The RCA is dominant. The vessel is smooth throughout its course. There is mild catheter induced vasospasm. The PDA is widely patent. There is no obstructive disease throughout the RCA distribution.  Left ventriculography: Left ventricular systolic function is severely reduced. There is a large area of akinesis from the mid anterior wall extending into the apex and distal inferior wall. The left ventricular ejection fraction is estimated at 35%. There is no significant mitral regurgitation.  PCI Data: Vessel - LAD/Segment - mid Percent Stenosis (pre)  100 TIMI-flow 0 Stent 2.25 x 24 mm Promus [place] Percent Stenosis (post) 0 TIMI-flow (post) 3  Final Conclusions:   #1. 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 Wide patency of the right coronary artery and left circumflex vessels #3. Severe segmental left ventricular systolic dysfunction  Recommendations: The patient will receive aggressive post MI medical therapy. Will repeat an echo prior to discharge and hopefully be LV EF will be improved as the patient presented within 6 hours of pain onset and had good ST segment resolution.  She will require dual antiplatelet therapy with aspirin and Plavix for at least 12 months.   ASSESSMENT & PLAN:   1.  CAD, native vessel, without symptoms of angina.  - Asymptomatic. Continue ASA, BB, statin and ACE. EKG without ischemic changes.   2. Essential hypertension. -Stable and well controlled. Continue metoprolol and lisinopril.  3. Hyperlipidemia. -  LDL was 120 mg/dL; Triglyceride 163; HDL 40 @ PCP office - 10/2015. Unable to do exercise since May 2017 due to tear meniscus. Discuss diet and exercise regimen at length. LDL goal less than 70. Continue atorvastatin and niacin. She has pending lab work by PCP next month.   4. Cardiac clearance. - No anginal symptoms. She is at low risk for surgery. Can hold ASA temporarily for surgery. Continue BB and statin.   5. Palpitations. - Improved. Advised to cut back on caffeine.     Medication Adjustments/Labs and Tests Ordered: Current medicines are reviewed at length with the patient today.  Concerns regarding medicines are outlined above.  Medication changes, Labs and Tests ordered today are listed in the Patient Instructions below. Patient Instructions  Medication Instructions: Your physician recommends that  you continue on your current medications as directed. Please refer to the Current Medication list given to you today.   Labwork: None Ordered  Procedures/Testing: None Ordered  Follow-Up: Your physician wants you to follow-up in 6 MONTHS with Dr. Burt Knack. You will receive a reminder letter in the mail two months in advance. If you don't receive a letter, please call our office to schedule the follow-up appointment.   Any Additional Special Instructions Will Be Listed Below (If Applicable).  Deloris Ping deemed necessary, you may hold your Aspirin for you procedure. Please consult with surgeon.Marland Kitchen    If you need a refill on your cardiac medications before your next appointment, please call your pharmacy.      Jarrett Soho, [place]  04/26/2016 9:07 AM    [facility] [place], [place], [place]  [address] Phone: [phone]; Fax: [phone]

## 2016-07-22 ENCOUNTER — Other Ambulatory Visit: Payer: Self-pay | Admitting: Cardiovascular Disease

## 2016-08-01 ENCOUNTER — Encounter: Payer: Self-pay | Admitting: Allergy and Immunology

## 2016-08-01 ENCOUNTER — Ambulatory Visit (INDEPENDENT_AMBULATORY_CARE_PROVIDER_SITE_OTHER): Payer: BC Managed Care – PPO | Admitting: Allergy and Immunology

## 2016-08-01 VITALS — BP 138/98 | HR 72 | Temp 98.1°F | Resp 20 | Ht 60.0 in | Wt 201.0 lb

## 2016-08-01 DIAGNOSIS — R519 Headache, unspecified: Secondary | ICD-10-CM

## 2016-08-01 DIAGNOSIS — Z79899 Other long term (current) drug therapy: Secondary | ICD-10-CM | POA: Diagnosis not present

## 2016-08-01 DIAGNOSIS — R05 Cough: Secondary | ICD-10-CM | POA: Diagnosis not present

## 2016-08-01 DIAGNOSIS — R059 Cough, unspecified: Secondary | ICD-10-CM

## 2016-08-01 DIAGNOSIS — R51 Headache: Secondary | ICD-10-CM

## 2016-08-01 DIAGNOSIS — K219 Gastro-esophageal reflux disease without esophagitis: Secondary | ICD-10-CM | POA: Diagnosis not present

## 2016-08-01 DIAGNOSIS — J3089 Other allergic rhinitis: Secondary | ICD-10-CM

## 2016-08-01 MED ORDER — OMEPRAZOLE 20 MG PO CPDR: 20.0000 mg | DELAYED_RELEASE_CAPSULE | Freq: Every day | ORAL | 5 refills | Status: DC

## 2016-08-01 MED ORDER — FLUTICASONE PROPIONATE 50 MCG/ACT NA SUSP: 2.0000 | Freq: Every day | NASAL | 5 refills | Status: DC

## 2016-08-01 MED ORDER — MONTELUKAST SODIUM 10 MG PO TABS: 10.0000 mg | ORAL_TABLET | Freq: Every day | ORAL | 5 refills | Status: DC

## 2016-08-01 MED ORDER — RANITIDINE HCL 300 MG PO TABS: 300.0000 mg | ORAL_TABLET | Freq: Every day | ORAL | 5 refills | Status: DC

## 2016-08-01 MED ORDER — LOSARTAN POTASSIUM 50 MG PO TABS: 50.0000 mg | ORAL_TABLET | Freq: Every day | ORAL | 5 refills | Status: DC

## 2016-08-01 NOTE — Progress Notes (Signed)
Dear Dr. Nevada Crane,  Thank you for referring Teresa Wolf to the [facility] on 08/01/2016.   Below is a summation of this patient's evaluation and recommendations.  Thank you for your referral. I will keep you informed about this patient's response to treatment.   If you have any questions please do not hesitate to contact me.   Sincerely,  Jiles Prows, MD [place]   ______________________________________________________________________    NEW PATIENT NOTE  Referring Provider: Celene Squibb, MD Primary Provider: Wende Neighbors, MD Date of office visit: 08/01/2016    Subjective:   Chief Complaint:  Teresa Wolf (DOB: 1965-10-04) is a 50 y.o. female who presents to the clinic on 08/01/2016 with a chief complaint of Nasal Congestion .     HPI: Teresa Wolf presents this clinic in evaluation of cough.  Teresa Wolf has had an issue with cough because her throat is "congested". She has throat clearing and a tickle stuck in her throat and a glob stuck in her throat and she can't clear out her throat and gets intermittently raspy. She's had this problem for many years but has been particularly bad over the course of the past 2 years. She appears to be always coughing. She does not have any associated shortness of breath or chest tightness or chest pain.  Teresa Wolf does have reflux. She has reflux with burning up into her throat for which she uses Tums. This occurs on a daily basis. She drinks at least 6 diet Cokes per day and has chocolate several times per week.  Teresa Wolf also has an issue with her upper airways. She has runny nose and clear rhinorrhea and occasional itchy eyes and sneezing. There is no associated anosmia or ugly nasal discharge. There is no obvious trigger. She's been given Flonase over the course of the past several weeks which she's used intermittently which may help her somewhat  regarding this issue.  Teresa Wolf also has headaches. She gets a right temporal periorbital headache that is sometimes behind her eye and is pounding. This occurs about 1 time per week. She does not have any associated scotoma nor does she have any associated nausea or vomiting. On occasion throughout the year, maybe averaging out to 3 times per year she will develop a very bad headache that sometimes makes her vomit. Once again, there is no obvious provoking factor giving rise to this issue.  Past Medical History:  Diagnosis Date  . Anxiety   . CAD in native artery    s/p Ant STEMI 10/18/11;  Emergent LHC 10/18/11: LAD occluded with thrombus, circumflex patent, RCA patent, EF 35% with mid anterior akinesis extending into the apex and distal inferior wall.  PCI: Promus DES to the mid LAD.  Follow up echocardiogram 10/18/11: EF 60-65%, mild focal basal septal hypertrophy, severe hypokinesis of the mid to distal anterior septal myocardium, grade 2 diastolic dysfuncti  . Cluster headaches    sporadic  . Depression   . HLD (hyperlipidemia)   . Hypertension   . Myocardial infarction   . SVT (supraventricular tachycardia) ([place])    post MI 2/13 converted with adenosine    Past Surgical History:  Procedure Laterality Date  . CESAREAN SECTION    . LEFT HEART CATHETERIZATION WITH CORONARY ANGIOGRAM N/A 10/18/2011   Procedure: LEFT HEART CATHETERIZATION WITH CORONARY ANGIOGRAM;  Surgeon: Sherren Mocha, MD;  Location: [facility] CATH LAB;  Service: Cardiovascular;  Laterality: N/A;  . PERCUTANEOUS CORONARY STENT INTERVENTION (PCI-S)  10/18/2011   Procedure: PERCUTANEOUS CORONARY STENT INTERVENTION (PCI-S);  Surgeon: Sherren Mocha, MD;  Location: [facility] CATH LAB;  Service: Cardiovascular;;  . stent        Medication List      acetaminophen 500 MG tablet Commonly known as:  TYLENOL Take 1,000 mg by mouth every 6 (six) hours as needed for mild pain or headache.   aspirin 81 MG chewable tablet Chew 81 mg by  mouth once.   atorvastatin 40 MG tablet Commonly known as:  LIPITOR TAKE 1 TABLET BY MOUTH EVERY DAY   CALCIUM 1200 PO Take 1 tablet by mouth daily.   cetirizine 10 MG tablet Commonly known as:  ZYRTEC Take 10 mg by mouth daily.   citalopram 20 MG tablet Commonly known as:  CELEXA Take 20 mg by mouth daily.   famotidine 20 MG tablet Commonly known as:  PEPCID Take 20 mg by mouth daily as needed for heartburn or indigestion.   lisinopril 10 MG tablet Commonly known as:  PRINIVIL,ZESTRIL Take 10 mg by mouth daily.   LORazepam 1 MG tablet Commonly known as:  ATIVAN Take 1 mg by mouth every 8 (eight) hours.   metoprolol 50 MG tablet Commonly known as:  LOPRESSOR TAKE 1 TABLET TWICE A DAY   niacin 1000 MG CR tablet Commonly known as:  NIASPAN TAKE 1 TABLET BY MOUTH AT BEDTIME   nitroGLYCERIN 0.4 MG SL tablet Commonly known as:  NITROSTAT Place 0.4 mg under the tongue every 5 minutes as needed for chest pain (do not exceed 3 tabs)       Allergies  Allergen Reactions  . Penicillins Hives and Rash    Review of systems negative except as noted in HPI / PMHx or noted below:  Review of Systems  Constitutional: Negative.   HENT: Negative.   Eyes: Negative.   Respiratory: Negative.   Cardiovascular: Negative.   Gastrointestinal: Negative.   Genitourinary: Negative.   Musculoskeletal: Negative.   Skin: Negative.   Neurological: Negative.   Endo/Heme/Allergies: Negative.   Psychiatric/Behavioral: Negative.     Family History  Problem Relation Age of Onset  . Osteoporosis Mother   . Heart disease Mother 52    MI  . Heart attack Mother   . Asthma Daughter   . Breast cancer Maternal Aunt   . Diabetes Maternal Grandmother   . Hyperlipidemia Maternal Grandmother   . Cancer Maternal Grandmother     colon  . Heart disease Maternal Grandmother     great grandmother  . Cancer Paternal Grandfather     lung    Social History   Social History  . Marital  status: Single    Spouse name: N/A  . Number of children: 1  . Years of education: N/A   Occupational History  . 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 Smoking status: Never Smoker  . Smokeless tobacco: Never Used  . Alcohol use Yes     Comment: SOCIAL  . Drug use: No  . Sexual activity: Yes   Other Topics Concern  . Not on file   Social History Narrative  . No narrative on file    Environmental and Social history  Lives in a house with a dry environment, a cat located inside the household, carpeting in the bedroom, no plastic on the bed or pillow, no smoking ongoing with inside the household. She works in a Medical sales representative  job.  Objective:   Vitals:   08/01/16 0920  BP: (!) 138/98  Pulse: 72  Resp: 20  Temp: 98.1 F (36.7 C)   Height: 5' (152.4 cm) Weight: 201 lb (91.2 kg)  Physical Exam  Constitutional: She is well-developed, well-nourished, and in no distress.  Slightly raspy voice, throat clearing  HENT:  Head: Normocephalic. Head is without right periorbital erythema and without left periorbital erythema.  Right Ear: Tympanic membrane, external ear and ear canal normal.  Left Ear: Tympanic membrane, external ear and ear canal normal.  Nose: Nose normal. No mucosal edema or rhinorrhea.  Mouth/Throat: Uvula is midline, oropharynx is clear and moist and mucous membranes are normal. No oropharyngeal exudate.  Eyes: Conjunctivae and lids are normal. Pupils are equal, round, and reactive to light.  Neck: Trachea normal. No tracheal tenderness present. No tracheal deviation present. No thyromegaly present.  Cardiovascular: Normal rate, regular rhythm, S1 normal, S2 normal and normal heart sounds.   No murmur heard. Pulmonary/Chest: Effort normal and breath sounds normal. No stridor. No tachypnea. No respiratory distress. She has no wheezes. She has no rales. She exhibits no tenderness.  Abdominal: Soft. She exhibits no distension and no mass.  There is no hepatosplenomegaly. There is no tenderness. There is no rebound and no guarding.  Musculoskeletal: She exhibits no edema or tenderness.  Lymphadenopathy:       Head (right side): No tonsillar adenopathy present.       Head (left side): No tonsillar adenopathy present.    She has no cervical adenopathy.    She has no axillary adenopathy.  Neurological: She is alert. Gait normal.  Skin: Rash (Subtle blotchy erythematous patches involving face.) noted. She is not diaphoretic. No erythema. No pallor. Nails show no clubbing.  Psychiatric: Mood and affect normal.    Diagnostics: Allergy skin tests were performed. She demonstrated hypersensitivity to house dust mite and cat  Spirometry was performed. Her FEV1 was 2.30 which was 99% of predicted.  Assessment and Plan:    1. Other allergic rhinitis   2. LPRD (laryngopharyngeal reflux disease)   3. Headache disorder   4. On angiotensin-converting enzyme (ACE) inhibitors   5. Cough     1. Allergen avoidance measures  2. Treat and prevent inflammation:   A. continue Flonase 1-2 sprays each nostril one time per day  B. start montelukast 10 mg tablet 1 time per day  3. Treat and prevent reflux:   A. Taper down all caffeine and chocolate consumption. Aim for none  B. start omeprazole 40 mg tablet in AM  C. start ranitidine 300 mg tablet in PM  4. Treat and prevent headaches:   A. Taper down all caffeine and chocolate consumption. Aim for none  4. Change lisinopril to losartan 50 mg tablet 1 time per day. Check BP  5. If needed:   A. OTC antihistamine - Claritin/Zyrtec/Allegra  6. Return to clinic in 4 weeks or earlier if problem  7. Obtain fall flu vaccine  Aleina appears to have a collection of respiratory tract insults including her atopic disease and reflux-induced respiratory disease and I will have her utilize a plan to address both these issues as specified above. As well, she has chronic cephalgia and she  would benefit from tapering down all of her caffeine and chocolate consumption as much as possible. Finally, she is on a ACE inhibitor and is having some problems with chronic cough and we will eliminate this respiratory irritant to replace it with losartan.  She will check her blood pressure and follow up with her primary care doctor concerning further management of this issue. I'll see her back in this clinic in approximately 4 weeks or earlier if there is a problem.  Jiles Prows, MD [facility]

## 2016-08-01 NOTE — Patient Instructions (Addendum)
  1. Allergen avoidance measures  2. Treat and prevent inflammation:   A. continue Flonase 1-2 sprays each nostril one time per day  B. start montelukast 10 mg tablet 1 time per day  3. Treat and prevent reflux:   A. Taper down all caffeine and chocolate consumption. Aim for none  B. start omeprazole 40 mg tablet in AM  C. start ranitidine 300 mg tablet in PM  4. Treat and prevent headaches:   A. Taper down all caffeine and chocolate consumption. Aim for none  4. Change lisinopril to losartan 50 mg tablet 1 time per day. Check BP  5. If needed:   A. OTC antihistamine - Claritin/Zyrtec/Allegra  6. Return to clinic in 4 weeks or earlier if problem  7. Obtain fall flu vaccine

## 2016-09-20 ENCOUNTER — Ambulatory Visit: Payer: BC Managed Care – PPO | Admitting: Allergy and Immunology

## 2017-01-03 ENCOUNTER — Other Ambulatory Visit: Payer: Self-pay | Admitting: Allergy and Immunology

## 2017-01-17 ENCOUNTER — Encounter: Payer: Self-pay | Admitting: Gynecology

## 2017-01-31 ENCOUNTER — Other Ambulatory Visit: Payer: Self-pay | Admitting: Allergy and Immunology

## 2017-01-31 ENCOUNTER — Telehealth: Payer: Self-pay | Admitting: Allergy and Immunology

## 2017-01-31 NOTE — Telephone Encounter (Signed)
Left message to inform her that we are unable to fill her medication due to the refill that we gave her on 01/03/17. Patient was told then to schedule and appointment to receive further refills.

## 2017-01-31 NOTE — Telephone Encounter (Signed)
Patient is requesting refills for losartan and zantac, [facility]. She was suppose to be seen this winter when we were closed for a snow storm. She never made an appt again. She made one today for 6-26.

## 2017-02-27 ENCOUNTER — Encounter: Payer: Self-pay | Admitting: Allergy and Immunology

## 2017-02-27 ENCOUNTER — Ambulatory Visit (INDEPENDENT_AMBULATORY_CARE_PROVIDER_SITE_OTHER): Payer: BC Managed Care – PPO | Admitting: Allergy and Immunology

## 2017-02-27 VITALS — BP 120/76 | HR 96 | Resp 17 | Ht 61.0 in | Wt 215.8 lb

## 2017-02-27 DIAGNOSIS — J3089 Other allergic rhinitis: Secondary | ICD-10-CM | POA: Diagnosis not present

## 2017-02-27 DIAGNOSIS — K219 Gastro-esophageal reflux disease without esophagitis: Secondary | ICD-10-CM

## 2017-02-27 DIAGNOSIS — R51 Headache: Secondary | ICD-10-CM | POA: Diagnosis not present

## 2017-02-27 DIAGNOSIS — R519 Headache, unspecified: Secondary | ICD-10-CM

## 2017-02-27 NOTE — Progress Notes (Signed)
Follow-up Note  Referring Provider: Celene Squibb, MD Primary Provider: Celene Squibb, MD Date of Office Visit: 02/27/2017  Subjective:   Teresa Wolf (DOB: 1966/07/21) is a 51 y.o. female who returns to the [facility] on 02/27/2017 in re-evaluation of the following:  HPI: Teresa Wolf returns to this clinic in reevaluation of her cough and allergies. I last saw her in this clinic November 2017 which was her initial evaluation at which point in time we addressed this issue by treating her for respiratory tract inflammation, reflux, and eliminating her lisinopril.  She has completely resolved her cough. She has no issues with her throat. She no longer has a tickle stuck in her throat and a glob stuck in her throat. She no longer has any reflux. She is down to drinking 2 Cokes per day from a high of six daily. She is not consuming any chocolate.  She has no problems with her nose. She is performed house dust avoidance measures.  She still get headaches one or 2 times per week. As noted above she still continues to drink 2 Cokes per day.  Allergies as of 02/27/2017      Reactions   Penicillins Hives, Rash      Medication List      acetaminophen 500 MG tablet Commonly known as:  TYLENOL Take 1,000 mg by mouth every 6 (six) hours as needed for mild pain or headache.   aspirin 81 MG chewable tablet Chew 81 mg by mouth once.   atorvastatin 40 MG tablet Commonly known as:  LIPITOR TAKE 1 TABLET BY MOUTH EVERY DAY   CALCIUM 1200 PO Take 1 tablet by mouth daily.   cetirizine 10 MG tablet Commonly known as:  ZYRTEC Take 10 mg by mouth daily.   citalopram 20 MG tablet Commonly known as:  CELEXA Take 20 mg by mouth daily.   fluticasone 50 MCG/ACT nasal spray Commonly known as:  FLONASE Place 2 sprays into both nostrils daily.   LORazepam 1 MG tablet Commonly known as:  ATIVAN Take 1 mg by mouth every 8 (eight) hours.   losartan 50 MG tablet Commonly known  as:  COZAAR TAKE 1 TABLET BY MOUTH EVERY DAY   metoprolol tartrate 50 MG tablet Commonly known as:  LOPRESSOR TAKE 1 TABLET TWICE A DAY   montelukast 10 MG tablet Commonly known as:  SINGULAIR Take 1 tablet (10 mg total) by mouth at bedtime.   niacin 1000 MG CR tablet Commonly known as:  NIASPAN TAKE 1 TABLET BY MOUTH AT BEDTIME   nitroGLYCERIN 0.4 MG SL tablet Commonly known as:  NITROSTAT Place 0.4 mg under the tongue every 5 minutes as needed for chest pain (do not exceed 3 tabs)   omeprazole 20 MG capsule Commonly known as:  PRILOSEC TAKE ONE CAPSULE BY MOUTH EVERY DAY   ranitidine 300 MG tablet Commonly known as:  ZANTAC TAKE 1 TABLET BY MOUTH EVERY EVENING AT BEDTIME       Past Medical History:  Diagnosis Date  . Anxiety   . CAD in native artery    s/p Ant STEMI 10/18/11;  Emergent LHC 10/18/11: LAD occluded with thrombus, circumflex patent, RCA patent, EF 35% with mid anterior akinesis extending into the apex and distal inferior wall.  PCI: Promus DES to the mid LAD.  Follow up echocardiogram 10/18/11: EF 60-65%, mild focal basal septal hypertrophy, severe hypokinesis of the mid to distal anterior septal myocardium, grade 2 diastolic dysfuncti  .  Cluster headaches    sporadic  . Depression   . HLD (hyperlipidemia)   . Hypertension   . Myocardial infarction ([place])   . SVT (supraventricular tachycardia) ([place])    post MI 2/13 converted with adenosine    Past Surgical History:  Procedure Laterality Date  . CESAREAN SECTION    . LEFT HEART CATHETERIZATION WITH CORONARY ANGIOGRAM N/A 10/18/2011   Procedure: LEFT HEART CATHETERIZATION WITH CORONARY ANGIOGRAM;  Surgeon: Sherren Mocha, MD;  Location: [facility] LAB;  Service: Cardiovascular;  Laterality: N/A;  . PERCUTANEOUS CORONARY STENT INTERVENTION (PCI-S)  10/18/2011   Procedure: PERCUTANEOUS CORONARY STENT INTERVENTION (PCI-S);  Surgeon: Sherren Mocha, MD;  Location: [facility] CATH LAB;  Service: Cardiovascular;;  . stent        Review of systems negative except as noted in HPI / PMHx or noted below:  Review of Systems  Constitutional: Negative.   HENT: Negative.   Eyes: Negative.   Respiratory: Negative.   Cardiovascular: Negative.   Gastrointestinal: Negative.   Genitourinary: Negative.   Musculoskeletal: Negative.   Skin: Negative.   Neurological: Negative.   Endo/Heme/Allergies: Negative.   Psychiatric/Behavioral: Negative.      Objective:   Vitals:   02/27/17 1623  BP: 120/76  Pulse: 96  Resp: 17   Height: 5\' 1"  (154.9 cm)  Weight: 215 lb 12.8 oz (97.9 kg)   Physical Exam  Constitutional: She is well-developed, well-nourished, and in no distress.  HENT:  Head: Normocephalic.  Right Ear: Tympanic membrane, external ear and ear canal normal.  Left Ear: Tympanic membrane, external ear and ear canal normal.  Nose: Nose normal. No mucosal edema or rhinorrhea.  Mouth/Throat: Uvula is midline, oropharynx is clear and moist and mucous membranes are normal. No oropharyngeal exudate.  Eyes: Conjunctivae are normal.  Neck: Trachea normal. No tracheal tenderness present. No tracheal deviation present. No thyromegaly present.  Cardiovascular: Normal rate, regular rhythm, S1 normal, S2 normal and normal heart sounds.   No murmur heard. Pulmonary/Chest: Breath sounds normal. No stridor. No respiratory distress. She has no wheezes. She has no rales.  Musculoskeletal: She exhibits no edema.  Lymphadenopathy:       Head (right side): No tonsillar adenopathy present.       Head (left side): No tonsillar adenopathy present.    She has no cervical adenopathy.  Neurological: She is alert. Gait normal.  Skin: No rash noted. She is not diaphoretic. No erythema. Nails show no clubbing.  Psychiatric: Mood and affect normal.    Diagnostics: none   Assessment and Plan:   1. Other allergic rhinitis   2. LPRD (laryngopharyngeal reflux disease)   3. Headache disorder     1. Continue to perform  Allergen avoidance measures  2. Treat and prevent inflammation:   A. continue Flonase 1-2 sprays each nostril one time per day  B. Discontinue montelukast    3. Treat and prevent reflux:   A. Taper down all caffeine and chocolate consumption. Aim for none  B. start omeprazole 40 mg tablet in AM  C. Discontinue ranitidine    4. Treat and prevent headaches:   A. Taper down all caffeine and chocolate consumption. Aim for none  5. If needed:   A. OTC antihistamine - Claritin/Zyrtec/Allegra  6. Return to clinic in 6 months or earlier if problem  7. Obtain fall flu vaccine  Teresa Wolf is much better at this point in time and we will now see we can consolidate her medical therapy as noted above by discontinuing  her montelukast and her ranitidine. I have encouraged her to taper off all caffeine consumption to treat not just her reflux issue but also her chronic headache issue. I will see her back in this clinic in 6 months or earlier if there is a problem.  Allena Katz, MD Allergy / Immunology [place]

## 2017-02-27 NOTE — Patient Instructions (Addendum)
  1. Continue to perform Allergen avoidance measures  2. Treat and prevent inflammation:   A. continue Flonase 1-2 sprays each nostril one time per day  B. Discontinue montelukast    3. Treat and prevent reflux:   A. Taper down all caffeine and chocolate consumption. Aim for none  B. start omeprazole 40 mg tablet in AM  C. Discontinue ranitidine    4. Treat and prevent headaches:   A. Taper down all caffeine and chocolate consumption. Aim for none  5. If needed:   A. OTC antihistamine - Claritin/Zyrtec/Allegra  6. Return to clinic in 6 months or earlier if problem  7. Obtain fall flu vaccine

## 2017-03-11 ENCOUNTER — Other Ambulatory Visit: Payer: Self-pay | Admitting: Allergy and Immunology

## 2017-03-12 ENCOUNTER — Telehealth: Payer: Self-pay | Admitting: Allergy and Immunology

## 2017-03-12 MED ORDER — LOSARTAN POTASSIUM 50 MG PO TABS: 50.0000 mg | ORAL_TABLET | Freq: Every day | ORAL | 3 refills | Status: AC

## 2017-03-12 MED ORDER — OMEPRAZOLE 40 MG PO CPDR: 40.0000 mg | DELAYED_RELEASE_CAPSULE | Freq: Every day | ORAL | 3 refills | Status: DC

## 2017-03-12 NOTE — Telephone Encounter (Signed)
Refilled losartan and Omeprazole as advised. Left message to make pt aware.

## 2017-03-12 NOTE — Telephone Encounter (Signed)
Please inform patient that we can refill for 3 months but it would probably be best to have her high blood pressure handled by her primary care doctor sometime this year.

## 2017-03-12 NOTE — Telephone Encounter (Signed)
Want [place] to refill these?

## 2017-03-12 NOTE — Telephone Encounter (Signed)
Patient is calling about her BP medications and Prilosec (generic) Dr Neldon Mc made changes to these medications - but did not put in for any refills for these changes Patient needs refills and is wondering if Dr Neldon Mc needs to do the refills since he made the changes to the medications or does she need to go back to her PCP Please call patient and advise Patient  uses [facility] on [address]

## 2017-03-31 ENCOUNTER — Other Ambulatory Visit: Payer: Self-pay | Admitting: Allergy and Immunology

## 2017-08-20 ENCOUNTER — Other Ambulatory Visit: Payer: Self-pay | Admitting: Allergy and Immunology

## 2018-06-01 ENCOUNTER — Other Ambulatory Visit: Payer: Self-pay | Admitting: Allergy and Immunology

## 2018-09-23 ENCOUNTER — Other Ambulatory Visit: Payer: Self-pay | Admitting: Internal Medicine

## 2018-09-23 DIAGNOSIS — Z1231 Encounter for screening mammogram for malignant neoplasm of breast: Secondary | ICD-10-CM

## 2018-09-30 ENCOUNTER — Ambulatory Visit
Admission: RE | Admit: 2018-09-30 | Discharge: 2018-09-30 | Disposition: A | Discharge status: Home / Self Care | Payer: BC Managed Care – PPO | Source: Ambulatory Visit | Attending: Internal Medicine | Admitting: Internal Medicine

## 2018-09-30 DIAGNOSIS — Z1231 Encounter for screening mammogram for malignant neoplasm of breast: Secondary | ICD-10-CM

## 2018-10-01 ENCOUNTER — Other Ambulatory Visit: Payer: Self-pay | Admitting: Internal Medicine

## 2018-10-01 DIAGNOSIS — R928 Other abnormal and inconclusive findings on diagnostic imaging of breast: Secondary | ICD-10-CM

## 2018-10-04 ENCOUNTER — Other Ambulatory Visit: Payer: Self-pay | Admitting: Internal Medicine

## 2018-10-04 ENCOUNTER — Ambulatory Visit
Admission: RE | Admit: 2018-10-04 | Discharge: 2018-10-04 | Disposition: A | Discharge status: Home / Self Care | Payer: BC Managed Care – PPO | Source: Ambulatory Visit | Attending: Internal Medicine | Admitting: Internal Medicine

## 2018-10-04 DIAGNOSIS — N6310 Unspecified lump in the right breast, unspecified quadrant: Secondary | ICD-10-CM

## 2018-10-04 DIAGNOSIS — R928 Other abnormal and inconclusive findings on diagnostic imaging of breast: Secondary | ICD-10-CM

## 2018-10-09 ENCOUNTER — Ambulatory Visit
Admission: RE | Admit: 2018-10-09 | Discharge: 2018-10-09 | Disposition: A | Discharge status: Home / Self Care | Payer: BC Managed Care – PPO | Source: Ambulatory Visit | Attending: Internal Medicine | Admitting: Internal Medicine

## 2018-10-09 ENCOUNTER — Other Ambulatory Visit: Payer: Self-pay | Admitting: Internal Medicine

## 2018-10-09 DIAGNOSIS — N6310 Unspecified lump in the right breast, unspecified quadrant: Secondary | ICD-10-CM

## 2018-10-18 ENCOUNTER — Ambulatory Visit: Payer: Self-pay | Admitting: Surgery

## 2018-10-18 DIAGNOSIS — D241 Benign neoplasm of right breast: Secondary | ICD-10-CM

## 2018-10-18 NOTE — H&P (Signed)
Teresa Wolf Documented: 10/18/2018 9:25 AM Location: [facility] Patient #: [id number] DOB: 02/01/1966 Divorced / Language: English / Race: White Female  History of Present Illness (Teresa Wolf A. Teresa Azbill MD; 10/18/2018 10:03 AM) Patient words: Patient sent at the request of Dr. Hall for abnormal screening mammogram. The patient went for her urinary mammogram and a density in the right breast upper outer quadrant was identified. The rash E2 areas of masslike lesion. The one 3 cm from the nipple core biopsy-proven to be papilloma. The other area was breast parenchyma. She is sent at the request of Dr. Hall in consultation for abnormal mammogram and papilloma. Patient denies any history of breast mass, nipple discharge or pain in either breast.                  52-year-old female recalled from baseline 2D mammogram dated 09/30/2022 a possible right breast mass.  EXAM: DIGITAL DIAGNOSTIC RIGHT MAMMOGRAM WITH CAD AND TOMO  ULTRASOUND RIGHT BREAST  COMPARISON: Previous exam(s).  ACR Breast Density Category b: There are scattered areas of fibroglandular density.  FINDINGS: The using a persistent, partially circumscribed partially obscured mass in the central lateral right breast at posterior depth. There may be associated calcifications. Further evaluation with ultrasound was performed.  Mammographic images were processed with CAD.  Targeted ultrasound is performed, showing a dilated duct with focal outpouching demonstrating associated vascularity at the 9 o'clock position 3 cm from the nipple. It measures 4 x 4 x 4 mm.  An additional hypoechoic mass with echogenic foci is demonstrated at the deep 9 o'clock position 6 cm from the nipple. It measures 6 x 4 x 4 mm. There is no definite associated vascularity. This demonstrates internal hyperechoic foci which may represent calcifications. This likely corresponds with the  mammographic finding.  Evaluation of the right axilla demonstrates no suspicious lymphadenopathy.  IMPRESSION: 1. Indeterminate right breast mass, likely intraductal, at the 9 o'clock position 3 cm from the nipple. Recommendation is for ultrasound-guided biopsy. 2. Indeterminate right breast mass at the deep 9 o'clock position 6 cm from the nipple, likely corresponding with the mammographically identified abnormality. Recommendation is for ultrasound-guided biopsy. 3. No suspicious right axillary lymphadenopathy.  RECOMMENDATION: Two area ultrasound-guided biopsy of the right breast.  I have discussed the findings and recommendations with the patient. Results were also provided in writing at the conclusion of the visit. If applicable, a reminder letter will be sent to the patient regarding the next appointment.  BI-RADS CATEGORY 4: Suspicious.   Electronically Signed By: Serena Chacko M.D. On: 10/04/2018 15:09          Diagnosis 1. Breast, right, needle core biopsy, lesion, 9 o'clock, 3cmfn, focally dilated duct with tissue - DUCTAL PAPILLOMA, MULTIPLE FRAGMENTS. 2. Breast, right, needle core biopsy, lesion, 9 o'clock, 6cmfn, focally dilated duct with tissue - BENIGN BREAST PARENCHYMA, SINGLE FRAGMENT. Microscopic Comment 1. The results were reported to [facility] on 10/10/2018. [name] MD Pathologist, Electronic Signature (Case signed 10/10/2018).  The patient is a 52 year old female.   Past Surgical History (Sabrina Canty, CMA; 10/18/2018 9:25 AM) Breast Biopsy Right. Cesarean Section - 1 Knee Surgery Right. Oral Surgery Valve Replacement  Diagnostic Studies History (Sabrina Canty, CMA; 10/18/2018 9:25 AM) Colonoscopy never Mammogram within last year Pap Smear 1-5 years ago  Allergies (Sabrina Canty, CMA; 10/18/2018 9:26 AM) Penicillins Dermatitis. Allergies Reconciled  Medication  History (Sabrina Canty, CMA; 10/18/2018 9:26 AM) LORazepam (0.5MG Tablet, Oral) Active. Atorvastatin Calcium (  40MG Tablet, Oral) Active. Azithromycin (250MG Tablet, Oral) Active. Losartan Potassium (50MG Tablet, Oral) Active. Metoprolol Tartrate (50MG Tablet, Oral) Active. Omeprazole (40MG Capsule DR, Oral) Active. Propranolol HCl (10MG Tablet, Oral) Active. Rizatriptan Benzoate (10MG Tablet, Oral) Active. Medications Reconciled  Social History (Sabrina Canty, CMA; 10/18/2018 9:25 AM) Alcohol use Occasional alcohol use. Caffeine use Carbonated beverages. No drug use Tobacco use Never smoker.  Family History (Sabrina Canty, CMA; 10/18/2018 9:25 AM) Arthritis Mother. Breast Cancer Family Members In General. Colon Cancer Family Members In General. Colon Polyps Mother. Heart Disease Family Members In General. Hypertension Family Members In General, Mother. Migraine Headache Mother. Rectal Cancer Family Members In General.  Pregnancy / Birth History (Sabrina Canty, CMA; 10/18/2018 9:25 AM) Age at menarche 15 years. Contraceptive History Oral contraceptives. Gravida 1 Irregular periods Maternal age 21-25 Para 1  Other Problems (Sabrina Canty, CMA; 10/18/2018 9:25 AM) Anxiety Disorder Gastroesophageal Reflux Disease High blood pressure Hypercholesterolemia Migraine Headache Myocardial infarction     Review of Systems (Sabrina Canty CMA; 10/18/2018 9:25 AM) General Not Present- Appetite Loss, Chills, Fatigue, Fever, Night Sweats, Weight Gain and Weight Loss. Skin Not Present- Change in Wart/Mole, Dryness, Hives, Jaundice, New Lesions, Non-Healing Wounds, Rash and Ulcer. HEENT Not Present- Earache, Hearing Loss, Hoarseness, Nose Bleed, Oral Ulcers, Ringing in the Ears, Seasonal Allergies, Sinus Pain, Sore Throat, Visual Disturbances, Wears glasses/contact lenses and Yellow Eyes. Respiratory Not Present- Bloody sputum, Chronic Cough,  Difficulty Breathing, Snoring and Wheezing. Breast Not Present- Breast Mass, Breast Pain, Nipple Discharge and Skin Changes. Cardiovascular Present- Palpitations. Not Present- Chest Pain, Difficulty Breathing Lying Down, Leg Cramps, Rapid Heart Rate, Shortness of Breath and Swelling of Extremities. Gastrointestinal Not Present- Abdominal Pain, Bloating, Bloody Stool, Change in Bowel Habits, Chronic diarrhea, Constipation, Difficulty Swallowing, Excessive gas, Gets full quickly at meals, Hemorrhoids, Indigestion, Nausea, Rectal Pain and Vomiting. Female Genitourinary Not Present- Frequency, Nocturia, Painful Urination, Pelvic Pain and Urgency. Musculoskeletal Not Present- Back Pain, Joint Pain, Joint Stiffness, Muscle Pain, Muscle Weakness and Swelling of Extremities. Neurological Not Present- Decreased Memory, Fainting, Headaches, Numbness, Seizures, Tingling, Tremor, Trouble walking and Weakness. Psychiatric Present- Anxiety. Not Present- Bipolar, Change in Sleep Pattern, Depression, Fearful and Frequent crying. Endocrine Not Present- Cold Intolerance, Excessive Hunger, Hair Changes, Heat Intolerance, Hot flashes and New Diabetes. Hematology Not Present- Blood Thinners, Easy Bruising, Excessive bleeding, Gland problems, HIV and Persistent Infections.  Vitals (Sabrina Canty CMA; 10/18/2018 9:27 AM) 10/18/2018 9:27 AM Weight: 212 lb Height: 61in Body Surface Area: 1.94 m Body Mass Index: 40.06 kg/m  Temp.: 97.3F(Temporal)  Pulse: 129 (Regular)  P.OX: 97% (Room air) BP: 124/68 (Sitting, Left Arm, Standard)      Physical Exam (Thereasa Iannello A. Jenya Putz MD; 10/18/2018 10:04 AM)  General Mental Status-Alert. General Appearance-Consistent with stated age. Hydration-Well hydrated. Voice-Normal.  Head and Neck Head-normocephalic, atraumatic with no lesions or palpable masses. Trachea-midline. Thyroid Gland Characteristics - normal size and  consistency.  Eye Eyeball - Bilateral-Extraocular movements intact. Sclera/Conjunctiva - Bilateral-No scleral icterus.  Chest and Lung Exam Chest and lung exam reveals -quiet, even and easy respiratory effort with no use of accessory muscles and on auscultation, normal breath sounds, no adventitious sounds and normal vocal resonance. Inspection Chest Wall - Normal. Back - normal. Note: Bruising right breast otherwise normal exam  Breast Breast - Left-Symmetric, Non Tender, No Biopsy scars, no Dimpling, No Inflammation, No Lumpectomy scars, No Mastectomy scars, No Peau d' Orange. Breast - Right-Symmetric, Non Tender, No Biopsy scars, no Dimpling, No Inflammation, No Lumpectomy scars, No   Mastectomy scars, No Peau d' Orange. Breast Lump-No Palpable Breast Mass.  Cardiovascular Cardiovascular examination reveals -normal heart sounds, regular rate and rhythm with no murmurs and normal pedal pulses bilaterally.  Neurologic Neurologic evaluation reveals -alert and oriented x 3 with no impairment of recent or remote memory. Mental Status-Normal.  Musculoskeletal Normal Exam - Left-Upper Extremity Strength Normal and Lower Extremity Strength Normal. Normal Exam - Right-Upper Extremity Strength Normal and Lower Extremity Strength Normal.  Lymphatic Head & Neck  General Head & Neck Lymphatics: Bilateral - Description - Normal. Axillary  General Axillary Region: Bilateral - Description - Normal. Tenderness - Non Tender.    Assessment & Plan (Angelina Venard A. Lelah Rennaker MD; 10/18/2018 10:04 AM)  INTRADUCTAL PAPILLOMA OF BREAST, RIGHT (D24.1) Impression: Discuss pros and cons of lumpectomy. Discussed rationale for lumpectomy. Discussed potential small risk of malignancy in this setting. She is agreed to proceed with a right breast seed lumpectomy. Risk of lumpectomy include bleeding, infection, seroma, more surgery, use of seed/wire, wound care, cosmetic deformity  and the need for other treatments, death , blood clots, death. Pt agrees to proceed.  Current Plans Pt Education - Pamphlet Given - Breast Biopsy: discussed with patient and provided information. Pt Education - [facility] Breast Biopsy HCI: discussed with patient and provided information. 

## 2018-10-18 NOTE — H&P (Signed)
Teresa Wolf Documented: 10/18/2018 9:25 AM Location: [facility] Patient #: [id number] DOB: 04/24/1966 Divorced / Language: English / Race: White Female  History of Present Illness (Jens Siems A. Nazaire Cordial MD; 10/18/2018 10:03 AM) Patient words: Patient sent at the request of Dr. Hall for abnormal screening mammogram. The patient went for her urinary mammogram and a density in the right breast upper outer quadrant was identified. The rash E2 areas of masslike lesion. The one 3 cm from the nipple core biopsy-proven to be papilloma. The other area was breast parenchyma. She is sent at the request of Dr. Hall in consultation for abnormal mammogram and papilloma. Patient denies any history of breast mass, nipple discharge or pain in either breast.                  52-year-old female recalled from baseline 2D mammogram dated 09/30/2022 a possible right breast mass.  EXAM: DIGITAL DIAGNOSTIC RIGHT MAMMOGRAM WITH CAD AND TOMO  ULTRASOUND RIGHT BREAST  COMPARISON: Previous exam(s).  ACR Breast Density Category b: There are scattered areas of fibroglandular density.  FINDINGS: The using a persistent, partially circumscribed partially obscured mass in the central lateral right breast at posterior depth. There may be associated calcifications. Further evaluation with ultrasound was performed.  Mammographic images were processed with CAD.  Targeted ultrasound is performed, showing a dilated duct with focal outpouching demonstrating associated vascularity at the 9 o'clock position 3 cm from the nipple. It measures 4 x 4 x 4 mm.  An additional hypoechoic mass with echogenic foci is demonstrated at the deep 9 o'clock position 6 cm from the nipple. It measures 6 x 4 x 4 mm. There is no definite associated vascularity. This demonstrates internal hyperechoic foci which may represent calcifications. This likely corresponds with the  mammographic finding.  Evaluation of the right axilla demonstrates no suspicious lymphadenopathy.  IMPRESSION: 1. Indeterminate right breast mass, likely intraductal, at the 9 o'clock position 3 cm from the nipple. Recommendation is for ultrasound-guided biopsy. 2. Indeterminate right breast mass at the deep 9 o'clock position 6 cm from the nipple, likely corresponding with the mammographically identified abnormality. Recommendation is for ultrasound-guided biopsy. 3. No suspicious right axillary lymphadenopathy.  RECOMMENDATION: Two area ultrasound-guided biopsy of the right breast.  I have discussed the findings and recommendations with the patient. Results were also provided in writing at the conclusion of the visit. If applicable, a reminder letter will be sent to the patient regarding the next appointment.  BI-RADS CATEGORY 4: Suspicious.   Electronically Signed By: Serena Chacko M.D. On: 10/04/2018 15:09          Diagnosis 1. Breast, right, needle core biopsy, lesion, 9 o'clock, 3cmfn, focally dilated duct with tissue - DUCTAL PAPILLOMA, MULTIPLE FRAGMENTS. 2. Breast, right, needle core biopsy, lesion, 9 o'clock, 6cmfn, focally dilated duct with tissue - BENIGN BREAST PARENCHYMA, SINGLE FRAGMENT. Microscopic Comment 1. The results were reported to [facility] on 10/10/2018. [name] MD Pathologist, Electronic Signature (Case signed 10/10/2018).  The patient is a 52 year old female.   Past Surgical History (Sabrina Canty, CMA; 10/18/2018 9:25 AM) Breast Biopsy Right. Cesarean Section - 1 Knee Surgery Right. Oral Surgery Valve Replacement  Diagnostic Studies History (Sabrina Canty, CMA; 10/18/2018 9:25 AM) Colonoscopy never Mammogram within last year Pap Smear 1-5 years ago  Allergies (Sabrina Canty, CMA; 10/18/2018 9:26 AM) Penicillins Dermatitis. Allergies Reconciled  Medication  History (Sabrina Canty, CMA; 10/18/2018 9:26 AM) LORazepam (0.5MG Tablet, Oral) Active. Atorvastatin Calcium (  40MG Tablet, Oral) Active. Azithromycin (250MG Tablet, Oral) Active. Losartan Potassium (50MG Tablet, Oral) Active. Metoprolol Tartrate (50MG Tablet, Oral) Active. Omeprazole (40MG Capsule DR, Oral) Active. Propranolol HCl (10MG Tablet, Oral) Active. Rizatriptan Benzoate (10MG Tablet, Oral) Active. Medications Reconciled  Social History (Sabrina Canty, CMA; 10/18/2018 9:25 AM) Alcohol use Occasional alcohol use. Caffeine use Carbonated beverages. No drug use Tobacco use Never smoker.  Family History (Sabrina Canty, CMA; 10/18/2018 9:25 AM) Arthritis Mother. Breast Cancer Family Members In General. Colon Cancer Family Members In General. Colon Polyps Mother. Heart Disease Family Members In General. Hypertension Family Members In General, Mother. Migraine Headache Mother. Rectal Cancer Family Members In General.  Pregnancy / Birth History (Sabrina Canty, CMA; 10/18/2018 9:25 AM) Age at menarche 15 years. Contraceptive History Oral contraceptives. Gravida 1 Irregular periods Maternal age 21-25 Para 1  Other Problems (Sabrina Canty, CMA; 10/18/2018 9:25 AM) Anxiety Disorder Gastroesophageal Reflux Disease High blood pressure Hypercholesterolemia Migraine Headache Myocardial infarction     Review of Systems (Sabrina Canty CMA; 10/18/2018 9:25 AM) General Not Present- Appetite Loss, Chills, Fatigue, Fever, Night Sweats, Weight Gain and Weight Loss. Skin Not Present- Change in Wart/Mole, Dryness, Hives, Jaundice, New Lesions, Non-Healing Wounds, Rash and Ulcer. HEENT Not Present- Earache, Hearing Loss, Hoarseness, Nose Bleed, Oral Ulcers, Ringing in the Ears, Seasonal Allergies, Sinus Pain, Sore Throat, Visual Disturbances, Wears glasses/contact lenses and Yellow Eyes. Respiratory Not Present- Bloody sputum, Chronic Cough,  Difficulty Breathing, Snoring and Wheezing. Breast Not Present- Breast Mass, Breast Pain, Nipple Discharge and Skin Changes. Cardiovascular Present- Palpitations. Not Present- Chest Pain, Difficulty Breathing Lying Down, Leg Cramps, Rapid Heart Rate, Shortness of Breath and Swelling of Extremities. Gastrointestinal Not Present- Abdominal Pain, Bloating, Bloody Stool, Change in Bowel Habits, Chronic diarrhea, Constipation, Difficulty Swallowing, Excessive gas, Gets full quickly at meals, Hemorrhoids, Indigestion, Nausea, Rectal Pain and Vomiting. Female Genitourinary Not Present- Frequency, Nocturia, Painful Urination, Pelvic Pain and Urgency. Musculoskeletal Not Present- Back Pain, Joint Pain, Joint Stiffness, Muscle Pain, Muscle Weakness and Swelling of Extremities. Neurological Not Present- Decreased Memory, Fainting, Headaches, Numbness, Seizures, Tingling, Tremor, Trouble walking and Weakness. Psychiatric Present- Anxiety. Not Present- Bipolar, Change in Sleep Pattern, Depression, Fearful and Frequent crying. Endocrine Not Present- Cold Intolerance, Excessive Hunger, Hair Changes, Heat Intolerance, Hot flashes and New Diabetes. Hematology Not Present- Blood Thinners, Easy Bruising, Excessive bleeding, Gland problems, HIV and Persistent Infections.  Vitals (Sabrina Canty CMA; 10/18/2018 9:27 AM) 10/18/2018 9:27 AM Weight: 212 lb Height: 61in Body Surface Area: 1.94 m Body Mass Index: 40.06 kg/m  Temp.: 97.3F(Temporal)  Pulse: 129 (Regular)  P.OX: 97% (Room air) BP: 124/68 (Sitting, Left Arm, Standard)      Physical Exam (Palmer Shorey A. Ivor Kishi MD; 10/18/2018 10:04 AM)  General Mental Status-Alert. General Appearance-Consistent with stated age. Hydration-Well hydrated. Voice-Normal.  Head and Neck Head-normocephalic, atraumatic with no lesions or palpable masses. Trachea-midline. Thyroid Gland Characteristics - normal size and  consistency.  Eye Eyeball - Bilateral-Extraocular movements intact. Sclera/Conjunctiva - Bilateral-No scleral icterus.  Chest and Lung Exam Chest and lung exam reveals -quiet, even and easy respiratory effort with no use of accessory muscles and on auscultation, normal breath sounds, no adventitious sounds and normal vocal resonance. Inspection Chest Wall - Normal. Back - normal. Note: Bruising right breast otherwise normal exam  Breast Breast - Left-Symmetric, Non Tender, No Biopsy scars, no Dimpling, No Inflammation, No Lumpectomy scars, No Mastectomy scars, No Peau d' Orange. Breast - Right-Symmetric, Non Tender, No Biopsy scars, no Dimpling, No Inflammation, No Lumpectomy scars, No   Mastectomy scars, No Peau d' Orange. Breast Lump-No Palpable Breast Mass.  Cardiovascular Cardiovascular examination reveals -normal heart sounds, regular rate and rhythm with no murmurs and normal pedal pulses bilaterally.  Neurologic Neurologic evaluation reveals -alert and oriented x 3 with no impairment of recent or remote memory. Mental Status-Normal.  Musculoskeletal Normal Exam - Left-Upper Extremity Strength Normal and Lower Extremity Strength Normal. Normal Exam - Right-Upper Extremity Strength Normal and Lower Extremity Strength Normal.  Lymphatic Head & Neck  General Head & Neck Lymphatics: Bilateral - Description - Normal. Axillary  General Axillary Region: Bilateral - Description - Normal. Tenderness - Non Tender.    Assessment & Plan (Ilee Randleman A. Dala Breault MD; 10/18/2018 10:04 AM)  INTRADUCTAL PAPILLOMA OF BREAST, RIGHT (D24.1) Impression: Discuss pros and cons of lumpectomy. Discussed rationale for lumpectomy. Discussed potential small risk of malignancy in this setting. She is agreed to proceed with a right breast seed lumpectomy. Risk of lumpectomy include bleeding, infection, seroma, more surgery, use of seed/wire, wound care, cosmetic deformity  and the need for other treatments, death , blood clots, death. Pt agrees to proceed.  Current Plans Pt Education - Pamphlet Given - Breast Biopsy: discussed with patient and provided information. Pt Education - [facility] Breast Biopsy HCI: discussed with patient and provided information. 

## 2018-11-04 ENCOUNTER — Other Ambulatory Visit: Payer: Self-pay | Admitting: Surgery

## 2018-11-04 DIAGNOSIS — D241 Benign neoplasm of right breast: Secondary | ICD-10-CM

## 2018-12-02 ENCOUNTER — Other Ambulatory Visit: Payer: Self-pay | Admitting: Adult Health Nurse Practitioner

## 2018-12-19 ENCOUNTER — Ambulatory Visit (HOSPITAL_BASED_OUTPATIENT_CLINIC_OR_DEPARTMENT_OTHER): Admit: 2018-12-19 | Payer: BC Managed Care – PPO | Admitting: Surgery

## 2018-12-19 ENCOUNTER — Encounter (HOSPITAL_BASED_OUTPATIENT_CLINIC_OR_DEPARTMENT_OTHER): Payer: Self-pay

## 2018-12-19 SURGERY — BREAST LUMPECTOMY WITH RADIOACTIVE SEED LOCALIZATION
Anesthesia: General | Site: Breast | Laterality: Right

## 2019-01-06 ENCOUNTER — Other Ambulatory Visit: Payer: Self-pay | Admitting: Adult Health Nurse Practitioner

## 2019-04-11 ENCOUNTER — Ambulatory Visit: Payer: Self-pay | Admitting: Surgery

## 2019-04-11 DIAGNOSIS — N6310 Unspecified lump in the right breast, unspecified quadrant: Secondary | ICD-10-CM

## 2019-04-11 NOTE — H&P (Signed)
Teresa Wolf Documented: 10/18/2018 9:25 AM Location: [facility] Patient #: [id number] DOB: August 09, 1966 Divorced / Language: Teresa Wolf / Race: White Female  History of Present Illness Teresa Moores A. Patra Gherardi MD; 10/18/2018 10:03 AM) Patient words: Patient sent at the request of Dr. [place] Crane for abnormal screening mammogram. The patient went for her urinary mammogram and a density in the right breast upper outer quadrant was identified. The rash E2 areas of masslike lesion. The one 3 cm from the nipple core biopsy-proven to be papilloma. The other area was breast parenchyma. She is sent at the request of Dr. [place] Crane in consultation for abnormal mammogram and papilloma. Patient denies any history of breast mass, nipple discharge or pain in either breast.                  53 year old female recalled from baseline 2D mammogram dated 09/30/2022 a possible right breast mass.  EXAM: DIGITAL DIAGNOSTIC RIGHT MAMMOGRAM WITH CAD AND TOMO  ULTRASOUND RIGHT BREAST  COMPARISON: Previous exam(s).  ACR Breast Density Category b: There are scattered areas of fibroglandular density.  FINDINGS: The using a persistent, partially circumscribed partially obscured mass in the central lateral right breast at posterior depth. There may be associated calcifications. Further evaluation with ultrasound was performed.  Mammographic images were processed with CAD.  Targeted ultrasound is performed, showing a dilated duct with focal outpouching demonstrating associated vascularity at the 9 o'clock position 3 cm from the nipple. It measures 4 x 4 x 4 mm.  An additional hypoechoic mass with echogenic foci is demonstrated at the deep 9 o'clock position 6 cm from the nipple. It measures 6 x 4 x 4 mm. There is no definite associated vascularity. This demonstrates internal hyperechoic foci which may represent calcifications. This likely corresponds with the  mammographic finding.  Evaluation of the right axilla demonstrates no suspicious lymphadenopathy.  IMPRESSION: 1. Indeterminate right breast mass, likely intraductal, at the 9 o'clock position 3 cm from the nipple. Recommendation is for ultrasound-guided biopsy. 2. Indeterminate right breast mass at the deep 9 o'clock position 6 cm from the nipple, likely corresponding with the mammographically identified abnormality. Recommendation is for ultrasound-guided biopsy. 3. No suspicious right axillary lymphadenopathy.  RECOMMENDATION: Two area ultrasound-guided biopsy of the right breast.  I have discussed the findings and recommendations with the patient. Results were also provided in writing at the conclusion of the visit. If applicable, a reminder letter will be sent to the patient regarding the next appointment.  BI-RADS CATEGORY 4: Suspicious.   Electronically Signed By: Kristopher Oppenheim M.D. On: 10/04/2018 15:09          Diagnosis 1. Breast, right, needle core biopsy, lesion, 9 o'clock, 3cmfn, focally dilated duct with tissue - DUCTAL PAPILLOMA, MULTIPLE FRAGMENTS. 2. Breast, right, needle core biopsy, lesion, 9 o'clock, 6cmfn, focally dilated duct with tissue - BENIGN BREAST PARENCHYMA, SINGLE FRAGMENT. Microscopic Comment 1. The results were reported to [facility] on 10/10/2018. Gillie Manners MD Pathologist, Electronic Signature (Case signed 10/10/2018).  The patient is a 53 year old female.   Past Surgical History Nance Pew, CMA; 10/18/2018 9:25 AM) Breast Biopsy Right. Cesarean Section - 1 Knee Surgery Right. Oral Surgery Valve Replacement  Diagnostic Studies History Nance Pew, CMA; 10/18/2018 9:25 AM) Colonoscopy never Mammogram within last year Pap Smear 1-5 years ago  Allergies Nance Pew, CMA; 10/18/2018 9:26 AM) Penicillins Dermatitis. Allergies Reconciled  Medication  History Nance Pew, CMA; 10/18/2018 9:26 AM) LORazepam (0.5MG  Tablet, Oral) Active. Atorvastatin Calcium (  40MG  Tablet, Oral) Active. Azithromycin (250MG  Tablet, Oral) Active. Losartan Potassium (50MG  Tablet, Oral) Active. Metoprolol Tartrate (50MG  Tablet, Oral) Active. Omeprazole (40MG  Capsule DR, Oral) Active. Propranolol HCl (10MG  Tablet, Oral) Active. Rizatriptan Benzoate (10MG  Tablet, Oral) Active. Medications Reconciled  Social History Nance Pew, CMA; 10/18/2018 9:25 AM) Alcohol use Occasional alcohol use. Caffeine use Carbonated beverages. No drug use Tobacco use Never smoker.  Family History Nance Pew, [place]; 10/18/2018 9:25 AM) Arthritis Mother. Breast Cancer Family Members In [place] Members In General. Colon Polyps Mother. Heart Disease Family Members In General. Hypertension Family Members In General, Mother. Migraine Headache Mother. Rectal Cancer Family Members In General.  Pregnancy / Birth History Nance Pew, [place]; 10/18/2018 9:25 AM) Age at menarche 22 years. Contraceptive History Oral contraceptives. Gravida 1 Irregular periods Maternal age 92-25 Para 1  Other Problems Nance Pew, CMA; 10/18/2018 9:25 AM) Anxiety Disorder Gastroesophageal Reflux Disease High blood pressure Hypercholesterolemia Migraine Headache Myocardial infarction     Review of Systems ([place]; 10/18/2018 9:25 AM) General Not Present- Appetite Loss, Chills, Fatigue, Fever, Night Sweats, Weight Gain and Weight Loss. Skin Not Present- Change in Wart/Mole, Dryness, Hives, Jaundice, New Lesions, Non-Healing Wounds, Rash and Ulcer. HEENT Not Present- Earache, Hearing Loss, Hoarseness, Nose Bleed, Oral Ulcers, Ringing in the Ears, Seasonal Allergies, Sinus Pain, Sore Throat, Visual Disturbances, Wears glasses/contact lenses and Yellow Eyes. Respiratory Not Present- Bloody sputum, Chronic Cough,  Difficulty Breathing, Snoring and Wheezing. Breast Not Present- Breast Mass, Breast Pain, Nipple Discharge and Skin Changes. Cardiovascular Present- Palpitations. Not Present- Chest Pain, Difficulty Breathing Lying Down, Leg Cramps, Rapid Heart Rate, Shortness of Breath and Swelling of Extremities. Gastrointestinal Not Present- Abdominal Pain, Bloating, Bloody Stool, Change in Bowel Habits, Chronic diarrhea, Constipation, Difficulty Swallowing, Excessive gas, Gets full quickly at meals, Hemorrhoids, Indigestion, Nausea, Rectal Pain and Vomiting. Female Genitourinary Not Present- Frequency, Nocturia, Painful Urination, Pelvic Pain and Urgency. Musculoskeletal Not Present- Back Pain, Joint Pain, Joint Stiffness, Muscle Pain, Muscle Weakness and Swelling of Extremities. Neurological Not Present- Decreased Memory, Fainting, Headaches, Numbness, Seizures, Tingling, Tremor, Trouble walking and Weakness. Psychiatric Present- Anxiety. Not Present- Bipolar, Change in Sleep Pattern, Depression, Fearful and Frequent crying. Endocrine Not Present- Cold Intolerance, Excessive Hunger, Hair Changes, Heat Intolerance, Hot flashes and New Diabetes. Hematology Not Present- Blood Thinners, Easy Bruising, Excessive bleeding, Gland problems, HIV and Persistent Infections.  Vitals (Sabrina Canty CMA; 10/18/2018 9:27 AM) 10/18/2018 9:27 AM Weight: 212 lb Height: 61in Body Surface Area: 1.94 m Body Mass Index: 40.06 kg/m  Temp.: 97.22F(Temporal)  Pulse: 129 (Regular)  P.OX: 97% (Room air) BP: 124/68 (Sitting, Left Arm, Standard)      Physical Exam (Aramis Weil A. Sharada Albornoz MD; 10/18/2018 10:04 AM)  General Mental Status-Alert. General Appearance-Consistent with stated age. Hydration-Well hydrated. Voice-Normal.  Head and Neck Head-normocephalic, atraumatic with no lesions or palpable masses. Trachea-midline. Thyroid Gland Characteristics - normal size and  consistency.  Eye Eyeball - Bilateral-Extraocular movements intact. Sclera/Conjunctiva - Bilateral-No scleral icterus.  Chest and Lung Exam Chest and lung exam reveals -quiet, even and easy respiratory effort with no use of accessory muscles and on auscultation, normal breath sounds, no adventitious sounds and normal vocal resonance. Inspection Chest Wall - Normal. Back - normal. Note: Bruising right breast otherwise normal exam  Breast Breast - Left-Symmetric, Non Tender, No Biopsy scars, no Dimpling, No Inflammation, No Lumpectomy scars, No Mastectomy scars, No Peau d' Orange. Breast - Right-Symmetric, Non Tender, No Biopsy scars, no Dimpling, No Inflammation, No Lumpectomy scars, No  Mastectomy scars, No Peau d' Orange. Breast Lump-No Palpable Breast Mass.  Cardiovascular Cardiovascular examination reveals -normal heart sounds, regular rate and rhythm with no murmurs and normal pedal pulses bilaterally.  Neurologic Neurologic evaluation reveals -alert and oriented x 3 with no impairment of recent or remote memory. Mental Status-Normal.  Musculoskeletal Normal Exam - Left-Upper Extremity Strength Normal and Lower Extremity Strength Normal. Normal Exam - Right-Upper Extremity Strength Normal and Lower Extremity Strength Normal.  Lymphatic Head & Neck  General Head & Neck Lymphatics: Bilateral - Description - Normal. Axillary  General Axillary Region: Bilateral - Description - Normal. Tenderness - Non Tender.    Assessment & Plan (Kimberlynn Lumbra A. Jenasia Dolinar MD; 10/18/2018 10:04 AM)  Madelyn Flavors PAPILLOMA OF BREAST, RIGHT (D24.1) Impression: Discuss pros and cons of lumpectomy. Discussed rationale for lumpectomy. Discussed potential small risk of malignancy in this setting. She is agreed to proceed with a right breast seed lumpectomy. Risk of lumpectomy include bleeding, infection, seroma, more surgery, use of seed/wire, wound care, cosmetic deformity  and the need for other treatments, death , blood clots, death. Pt agrees to proceed.  Current Plans Pt Education - Pamphlet Given - Breast Biopsy: discussed with patient and provided information. Pt Education - [facility] Breast Biopsy HCI: discussed with patient and provided information.

## 2019-04-23 ENCOUNTER — Other Ambulatory Visit: Payer: Self-pay | Admitting: Adult Health Nurse Practitioner

## 2019-04-24 ENCOUNTER — Encounter: Payer: Self-pay | Admitting: Obstetrics and Gynecology

## 2019-04-24 ENCOUNTER — Ambulatory Visit: Payer: BC Managed Care – PPO | Admitting: Obstetrics and Gynecology

## 2019-04-24 ENCOUNTER — Other Ambulatory Visit: Payer: Self-pay

## 2019-04-24 VITALS — BP 144/88 | HR 83 | Ht 61.0 in | Wt 215.4 lb

## 2019-04-24 DIAGNOSIS — I8290 Acute embolism and thrombosis of unspecified vein: Secondary | ICD-10-CM | POA: Diagnosis not present

## 2019-04-24 DIAGNOSIS — I863 Vulval varices: Secondary | ICD-10-CM

## 2019-04-24 NOTE — Patient Instructions (Signed)
t

## 2019-04-24 NOTE — Progress Notes (Signed)
Patient ID: Teresa Wolf, female   DOB: 1966/03/11, 53 y.o.   MRN: [id number]    [facility] Visit  @DATE @            Patient name: Teresa Wolf MRN [id number]  Date of birth: Mar 13, 1966  CC & HPI:  Teresa Wolf is a 53 y.o. female NEW GYN presenting today for bartholin cyst. Felt as if something was "bulging out" and went to Dr.Hall, nurse Loma Sousa told her she had bartholin cyst. She has taken warm bath to ease discomfort.  ROS:  ROS +vaginal hemtoma/ thrombosed varicose vein right labia minora  Pertinent History Reviewed:   Reviewed:  Medical         Past Medical History:  Diagnosis Date  . Anxiety   . CAD in native artery    s/p Ant STEMI 10/18/11;  Emergent LHC 10/18/11: LAD occluded with thrombus, circumflex patent, RCA patent, EF 35% with mid anterior akinesis extending into the apex and distal inferior wall.  PCI: Promus DES to the mid LAD.  Follow up echocardiogram 10/18/11: EF 60-65%, mild focal basal septal hypertrophy, severe hypokinesis of the mid to distal anterior septal myocardium, grade 2 diastolic dysfuncti  . Cluster headaches    sporadic  . Depression   . HLD (hyperlipidemia)   . Hypertension   . Myocardial infarction ([place])   . SVT (supraventricular tachycardia) ([place])    post MI 2/13 converted with adenosine                              Surgical Hx:    Past Surgical History:  Procedure Laterality Date  . CESAREAN SECTION    . LEFT HEART CATHETERIZATION WITH CORONARY ANGIOGRAM N/A 10/18/2011   Procedure: LEFT HEART CATHETERIZATION WITH CORONARY ANGIOGRAM;  Surgeon: Sherren Mocha, MD;  Location: [facility] CATH LAB;  Service: Cardiovascular;  Laterality: N/A;  . PERCUTANEOUS CORONARY STENT INTERVENTION (PCI-S)  10/18/2011   Procedure: PERCUTANEOUS CORONARY STENT INTERVENTION (PCI-S);  Surgeon: Sherren Mocha, MD;  Location: [facility] CATH LAB;  Service: Cardiovascular;;  . stent     Medications: Reviewed & Updated - see associated section                        Current Outpatient Medications:  .  acetaminophen (TYLENOL) 500 MG tablet, Take 1,000 mg by mouth every 6 (six) hours as needed for mild pain or headache. , Disp: , Rfl:  .  aspirin 81 MG chewable tablet, Chew 81 mg by mouth once. , Disp: , Rfl:  .  atorvastatin (LIPITOR) 40 MG tablet, TAKE 1 TABLET BY MOUTH EVERY DAY, Disp: 30 tablet, Rfl: 11 .  Calcium Carbonate-Vit D-Min (CALCIUM 1200 PO), Take 1 tablet by mouth daily. , Disp: , Rfl:  .  citalopram (CELEXA) 20 MG tablet, Take 20 mg by mouth daily. , Disp: , Rfl: 6 .  diphenhydrAMINE HCl (BENADRYL PO), Take by mouth. Takes 2 qhs, Disp: , Rfl:  .  LORazepam (ATIVAN) 1 MG tablet, Take 1 mg by mouth as needed. , Disp: , Rfl:  .  losartan (COZAAR) 50 MG tablet, Take 1 tablet (50 mg total) by mouth daily., Disp: 30 tablet, Rfl: 3 .  metoprolol (LOPRESSOR) 50 MG tablet, TAKE 1 TABLET TWICE A DAY, Disp: 60 tablet, Rfl: 9 .  omeprazole (PRILOSEC) 40 MG capsule, TAKE 1 CAPSULE BY MOUTH EVERY DAY (Patient taking differently: as needed. ),  Disp: 30 capsule, Rfl: 3 .  rizatriptan (MAXALT) 10 MG tablet, rizatriptan 10 mg tablet, Disp: , Rfl:    Social History: Reviewed -  reports that she has never smoked. She has never used smokeless tobacco.  Objective Findings:  Vitals: Blood pressure (!) 144/88, pulse 83, height 5\' 1"  (1.549 m), weight 215 lb 6.4 oz (97.7 kg).  PHYSICAL EXAMINATION General appearance - alert, well appearing, and in no distress Mental status - alert, oriented to person, place, and time, normal mood, behavior, speech, dress, motor activity, and thought processes, affect appropriate to mood  PELVIC pt has an area thought to be an elongate Bartholins cyst extending up the right labia minora.  Slight atypical shape.  Pt anesthetized and stab incision at 8 and 10 oclock revealed a large thrombosed clot, with 20+ cc of clot expressed. Pressure reduced. No word catheter placed. Throbosed labia minora deep varicosity Vagina -labial  hematoma/ thrombosed vein, epressed large amt of nonpurulent clot., no purulence, 1st clot around 30 cc Cervix - Not examined Uterus - Not examined    Assessment & Plan:   A:  1. Thrombosed labia minora varicosity 2. No Bartholins cyst  P:  1.  Rx antibiotics 2. PRN or 1 week if slow to heal  Pt given phone no for after hours access.  By signing my name below, I, Teresa Wolf, attest that this documentation has been prepared under the direction and in the presence of Jonnie Kind, MD. Electronically Signed: [place]. 04/24/19. 8:46 AM.  I personally performed the services described in this documentation, which was SCRIBED in my presence. The recorded information has been reviewed and considered accurate. It has been edited as necessary during review. Jonnie Kind, MD

## 2019-04-25 ENCOUNTER — Other Ambulatory Visit (HOSPITAL_COMMUNITY): Payer: BC Managed Care – PPO

## 2019-04-29 ENCOUNTER — Ambulatory Visit (HOSPITAL_BASED_OUTPATIENT_CLINIC_OR_DEPARTMENT_OTHER): Admit: 2019-04-29 | Payer: BC Managed Care – PPO | Admitting: Surgery

## 2019-04-29 ENCOUNTER — Encounter (HOSPITAL_BASED_OUTPATIENT_CLINIC_OR_DEPARTMENT_OTHER): Payer: Self-pay

## 2019-04-29 SURGERY — BREAST LUMPECTOMY WITH RADIOACTIVE SEED LOCALIZATION
Anesthesia: General | Site: Breast | Laterality: Right

## 2019-10-08 ENCOUNTER — Encounter: Payer: Self-pay | Admitting: Cardiovascular Disease

## 2019-11-01 ENCOUNTER — Ambulatory Visit: Payer: BC Managed Care – PPO

## 2019-11-14 ENCOUNTER — Ambulatory Visit: Payer: BC Managed Care – PPO | Attending: Internal Medicine

## 2019-11-14 ENCOUNTER — Other Ambulatory Visit: Payer: Self-pay

## 2019-11-14 DIAGNOSIS — Z20822 Contact with and (suspected) exposure to covid-19: Secondary | ICD-10-CM

## 2019-11-15 LAB — NOVEL CORONAVIRUS, NAA: SARS-CoV-2, NAA: NOT DETECTED

## 2020-01-21 ENCOUNTER — Ambulatory Visit: Payer: Self-pay | Admitting: Surgery

## 2020-01-21 DIAGNOSIS — D241 Benign neoplasm of right breast: Secondary | ICD-10-CM

## 2020-01-21 NOTE — H&P (Signed)
Teresa Wolf Appointment: 01/21/2020 12:00 PM Location: [facility] Patient #: [id number] DOB: 03/05/1966 Divorced / Language: Cleophus Molt / Race: White Female  History of Present Illness Teresa Moores A. Naliah Eddington MD; 01/21/2020 12:21 PM) Patient words: Patient returns 1 year after being seen for right breast intraductal papilloma. Her surgery was canceled due to the them and she is here to reschedule her right breast lumpectomy. She has no new complaints today.  The patient is a 54 year old female.   Allergies (Chanel Teressa Senter, CMA; 01/21/2020 11:55 AM) Penicillins Dermatitis. Allergies Reconciled  Medication History (Chanel Teressa Senter, CMA; 01/21/2020 11:56 AM) LORazepam (0.5MG  Tablet, Oral) Active. Atorvastatin Calcium (40MG  Tablet, Oral) Active. Losartan Potassium (50MG  Tablet, Oral) Active. Metoprolol Tartrate (50MG  Tablet, Oral) Active. Omeprazole (40MG  Capsule DR, Oral) Active. Rizatriptan Benzoate (10MG  Tablet, Oral) Active. Medications Reconciled    Vitals (Chanel Nolan CMA; 01/21/2020 11:56 AM) 01/21/2020 11:56 AM Weight: 214 lb Height: 61in Body Surface Area: 1.94 m Body Mass Index: 40.43 kg/m  Temp.: 98.69F  Pulse: 89 (Regular)         Physical Exam (Latice Waitman A. Amando Ishikawa MD; 01/21/2020 12:21 PM)  General Mental Status-Alert. General Appearance-Consistent with stated age. Hydration-Well hydrated. Voice-Normal.  Neurologic Neurologic evaluation reveals -alert and oriented x 3 with no impairment of recent or remote memory. Mental Status-Normal.    Assessment & Plan (Ziggy Chanthavong A. Jillian Warth MD; 01/21/2020 12:23 PM)  INTRADUCTAL PAPILLOMA OF BREAST, RIGHT (D24.1) Impression: Discuss pros and cons of lumpectomy. Discussed rationale for lumpectomy. Discussed potential small risk of malignancy in this setting. She is agreed to proceed with a right breast seed lumpectomy. Risk of lumpectomy include bleeding, infection, seroma, more surgery, use  of seed/wire, wound care, cosmetic deformity and the need for other treatments, death , blood clots, death. Pt agrees to proceed.  Total time 20 minutes reviewing patient chart, discussing surgery, patient's, scarring postoperative recovery course expectation  Current Plans You are being scheduled for surgery- Our schedulers will call you.  You should hear from our office's scheduling department within 5 working days about the location, date, and time of surgery. We try to make accommodations for patient's preferences in scheduling surgery, but sometimes the OR schedule or the surgeon's schedule prevents [place] from making those accommodations.  If you have not heard from our office [phone]) in 5 working days, call the office and ask for your surgeon's nurse.  If you have other questions about your diagnosis, plan, or surgery, call the office and ask for your surgeon's nurse.  Pt Education - Pamphlet Given - Breast Biopsy: discussed with patient and provided information.

## 2020-10-08 IMAGING — MG DIGITAL DIAGNOSTIC UNILATERAL RIGHT MAMMOGRAM WITH TOMO AND CAD
6 of 12 series · 6 of 36 positions shown · non-contrast
Comparison: Previous exam(s).

CLINICAL DATA: 52-year-old female recalled from baseline 2D
mammogram dated 09/30/2022 a possible right breast mass.

EXAM:
DIGITAL DIAGNOSTIC RIGHT MAMMOGRAM WITH CAD AND TOMO
ULTRASOUND RIGHT BREAST

[R MLO synth-2D (1 of 2)]
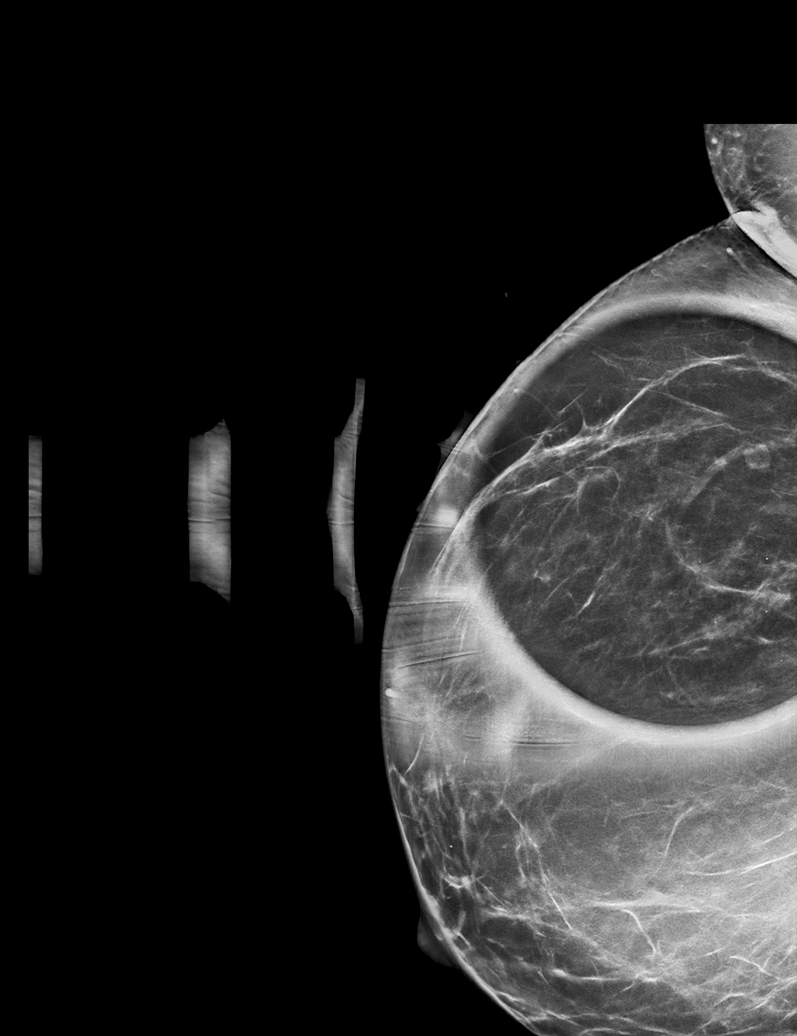

[R CC synth-2D (1 of 2)]
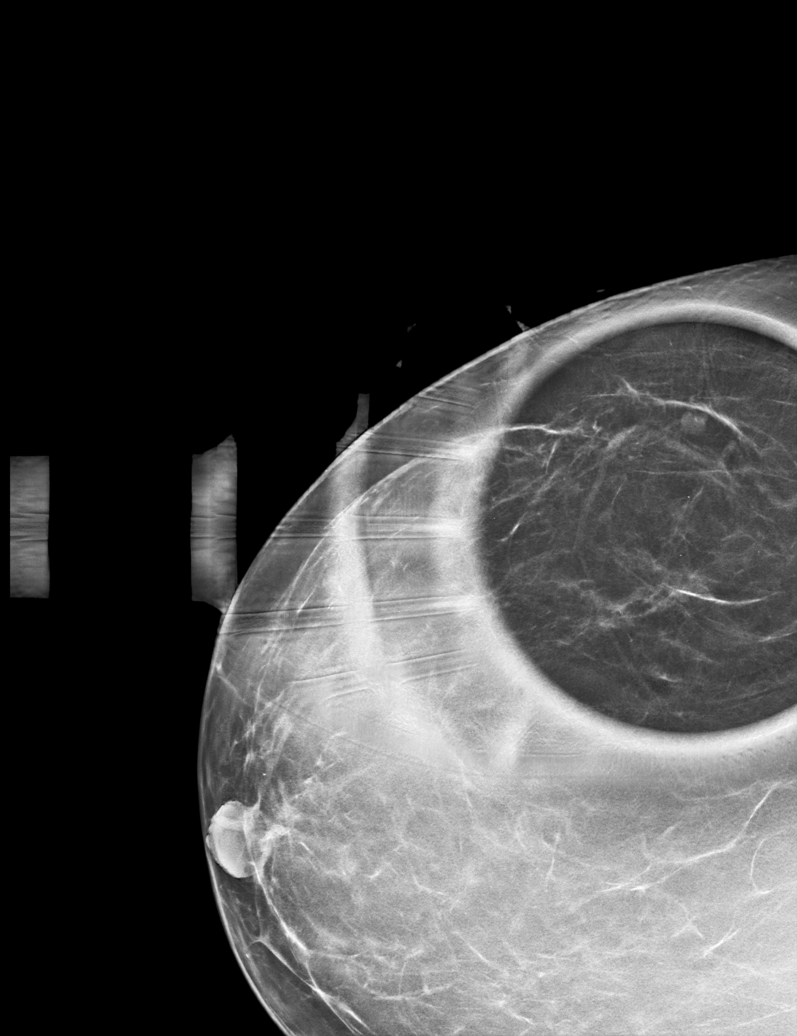

[R XCCL synth-2D]
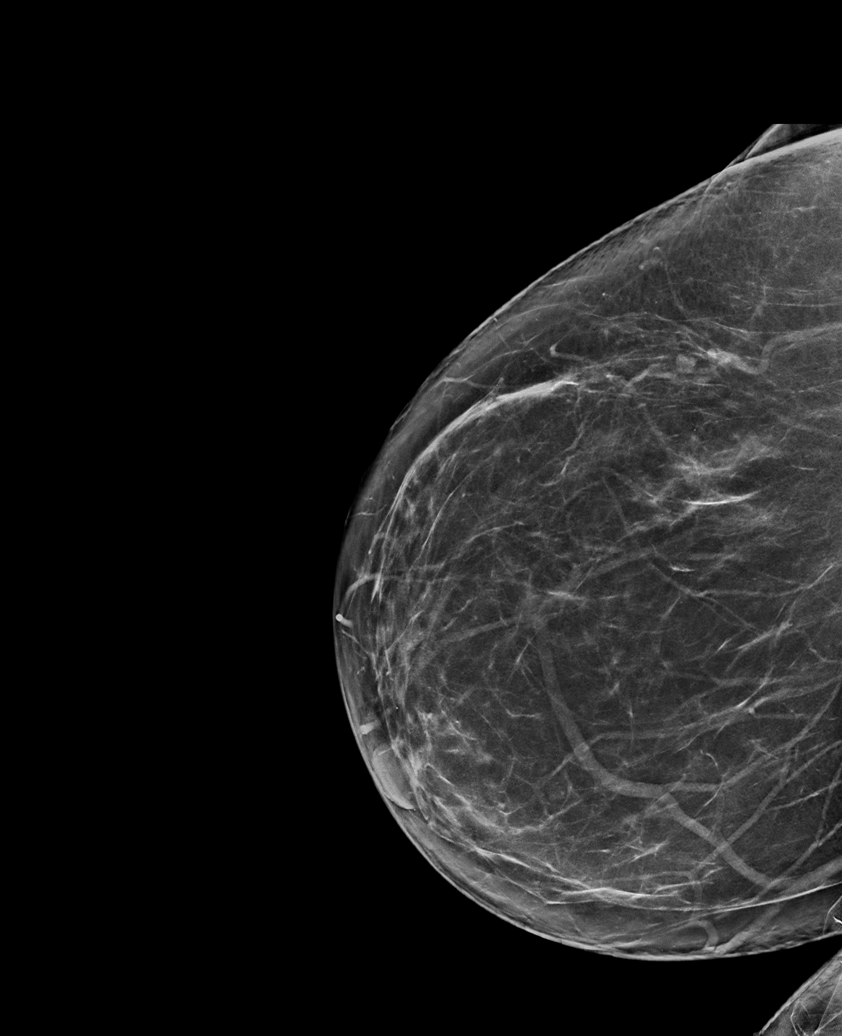

[R MLO synth-2D (2 of 2)]
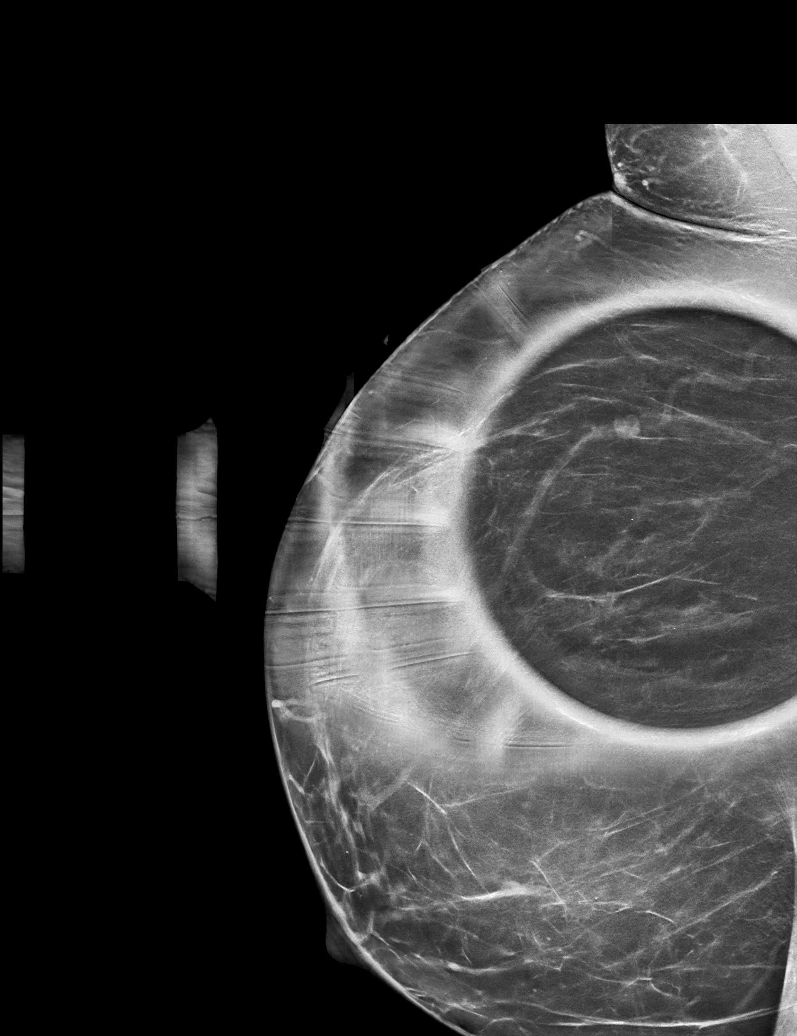

[R CC synth-2D (2 of 2)]
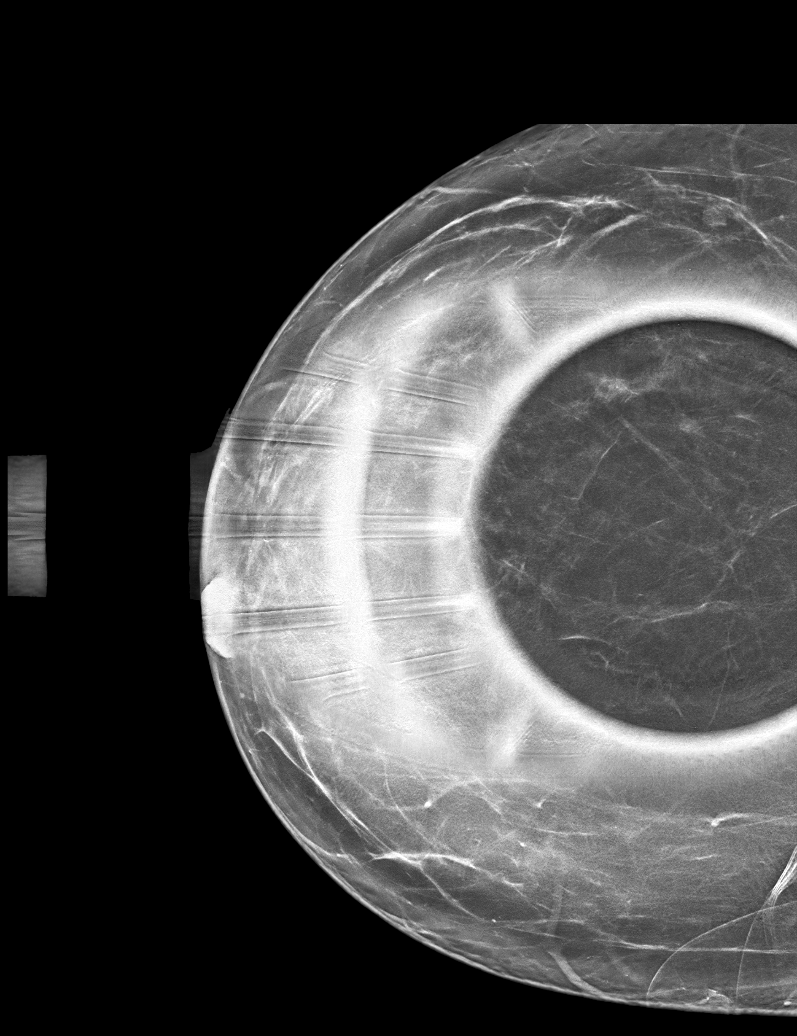

[R ML synth-2D]
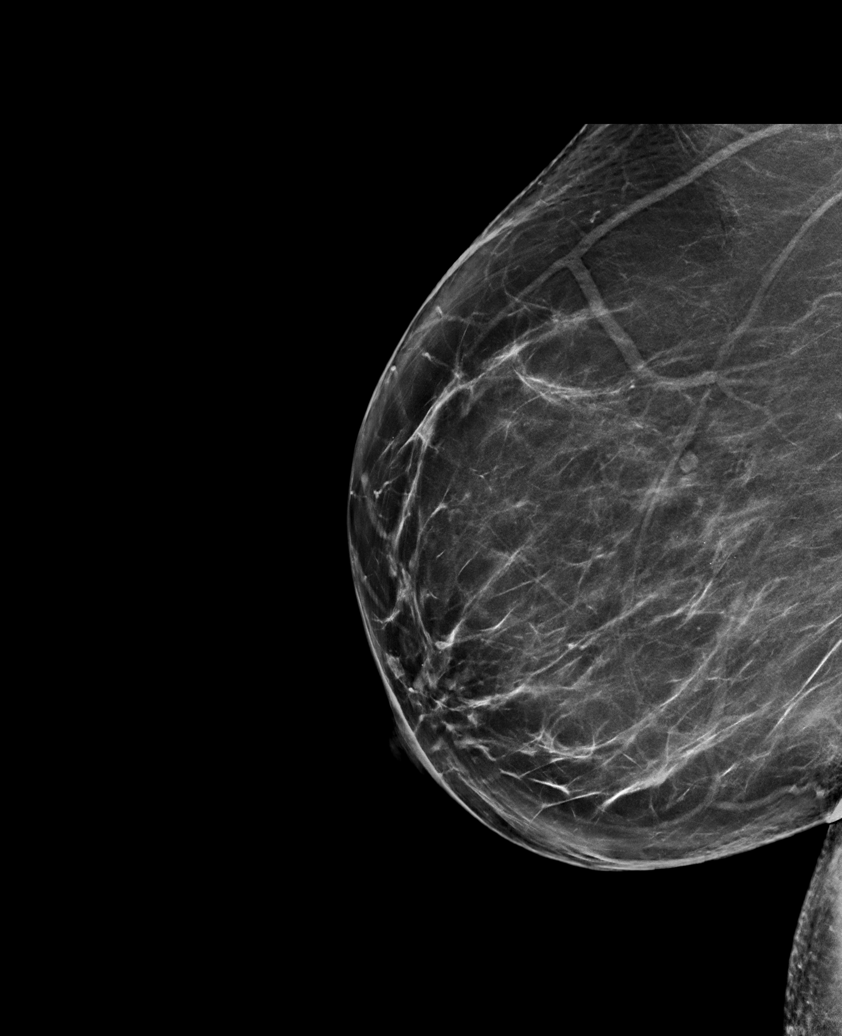

[6 of 36 positions shown; findings below may reference images not displayed]

ACR Breast Density Category b: There are scattered areas of
fibroglandular density.
FINDINGS: The using a persistent, partially circumscribed partially obscured
mass in the central lateral right breast at posterior depth. There
may be associated calcifications. Further evaluation with ultrasound
was performed.

Mammographic images were processed with CAD.

Targeted ultrasound is performed, showing a dilated duct with focal
outpouching demonstrating associated vascularity at the 9 o'clock
position 3 cm from the nipple. It measures 4 x 4 x 4 mm.

An additional hypoechoic mass with echogenic foci is demonstrated at
the deep 9 o'clock position 6 cm from the nipple. It measures 6 x 4
x 4 mm. There is no definite associated vascularity. This
demonstrates internal hyperechoic foci which may represent
calcifications. This likely corresponds with the mammographic
finding.

Evaluation of the right axilla demonstrates no suspicious
lymphadenopathy.
IMPRESSION: 1. Indeterminate right breast mass, likely intraductal, at the 9
o'clock position 3 cm from the nipple. Recommendation is for
ultrasound-guided biopsy.
2. Indeterminate right breast mass at the deep 9 o'clock position 6
cm from the nipple, likely corresponding with the mammographically
identified abnormality. Recommendation is for ultrasound-guided
biopsy.
3. No suspicious right axillary lymphadenopathy.

RECOMMENDATION:
Two area ultrasound-guided biopsy of the right breast.

I have discussed the findings and recommendations with the patient.
Results were also provided in writing at the conclusion of the
visit. If applicable, a reminder letter will be sent to the patient
regarding the next appointment.

BI-RADS CATEGORY  4: Suspicious.

## 2020-10-13 IMAGING — MG US BREAST BX W LOC DEV 1ST LESION IMG BX SPEC US GUIDE*R*
1 series · 8 of 8 positions shown · non-contrast
Comparison: Previous exam(s).

Addendum:
CLINICAL DATA: Patient presents for ultrasound-guided core needle
biopsy of 2 right breast lesions. One, at 9 o'clock, 3 cm the
nipple, is a focally dilated duct containing intraductal tissue with
vascularity on color Doppler analysis. The second lesion is at 9
o'clock, 6 cm the nipple, a 5-6 mm hypoechoic mass in the posterior
depth.

[Series 1: MG view · 0.07mm/px · 8 of 17 slices shown]
[im 1/17]
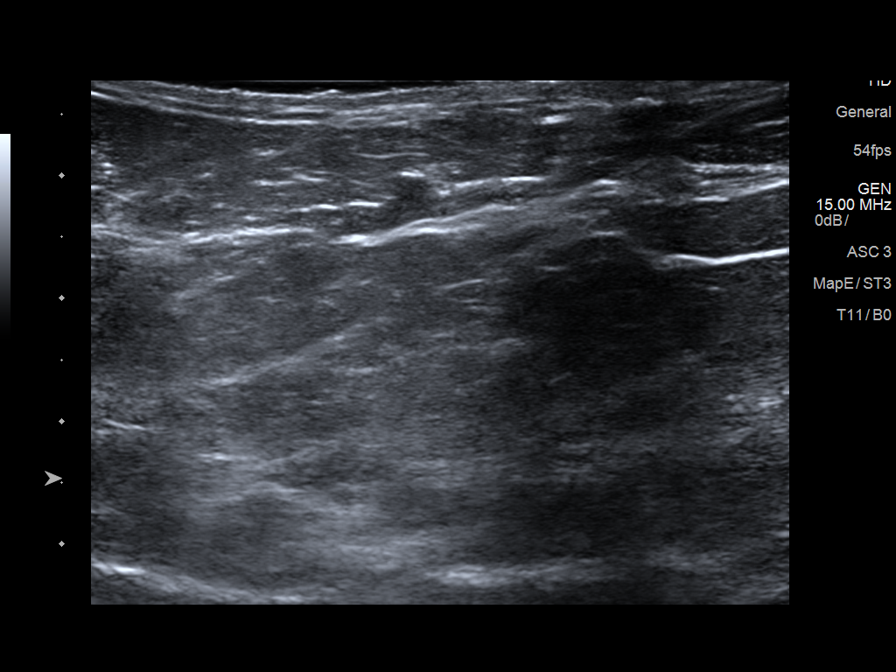
[im 3/17]
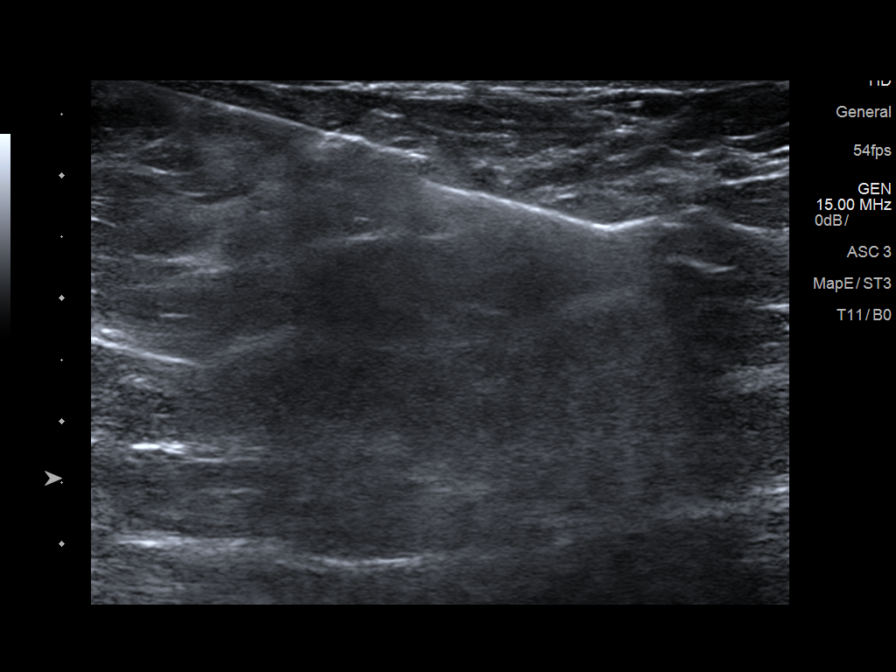
[im 5/17]
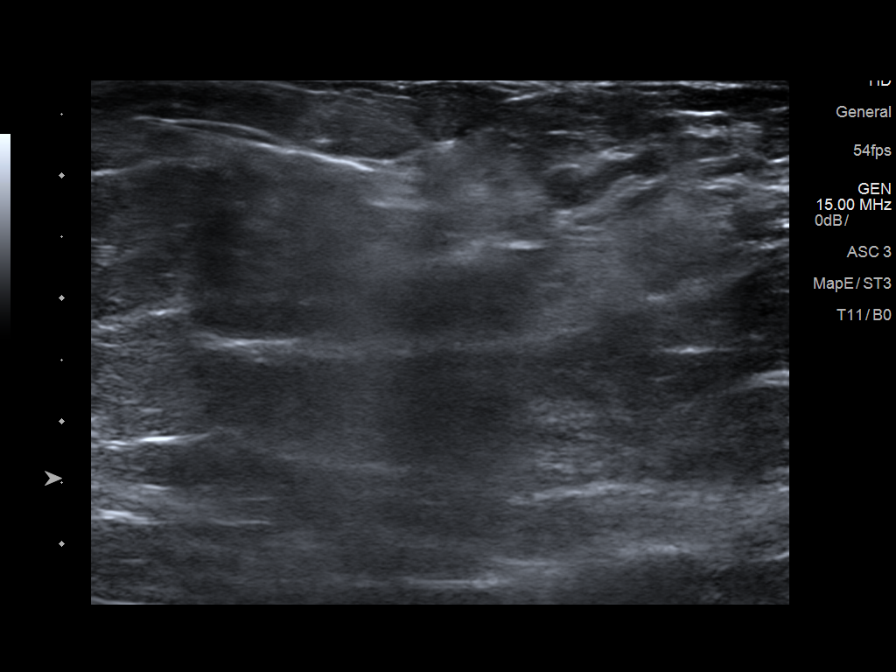
[im 7/17]
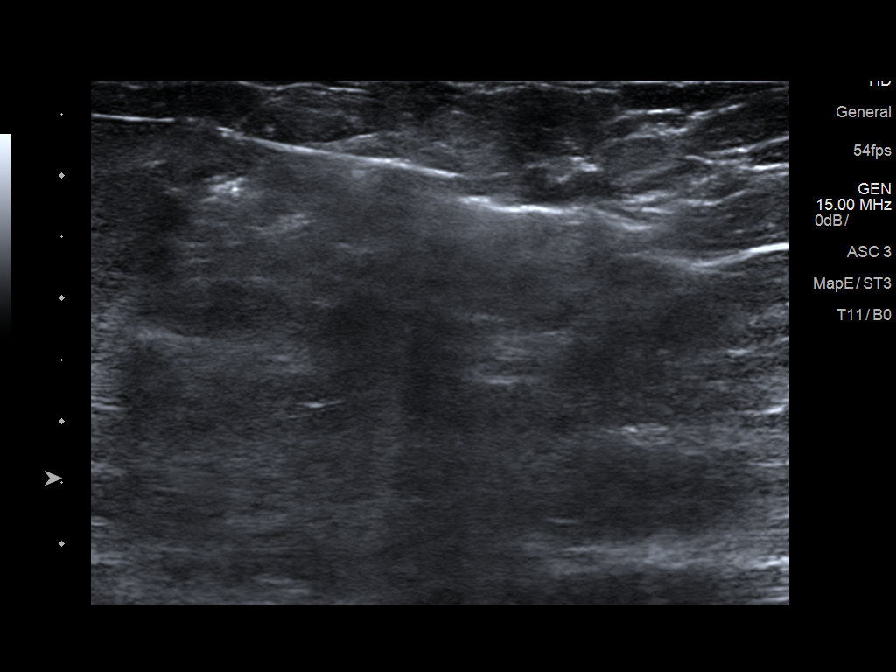
[im 10/17]
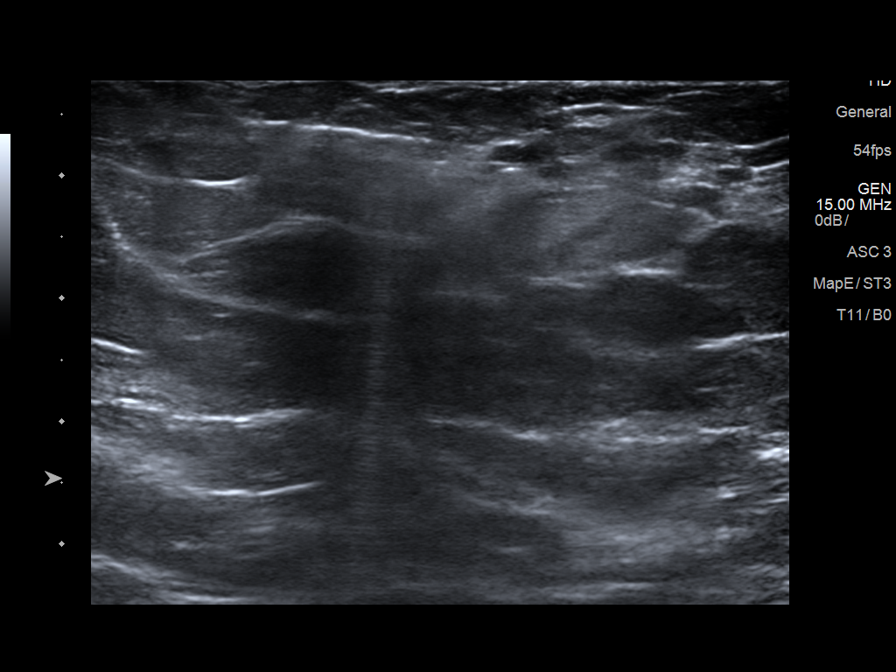
[im 12/17]
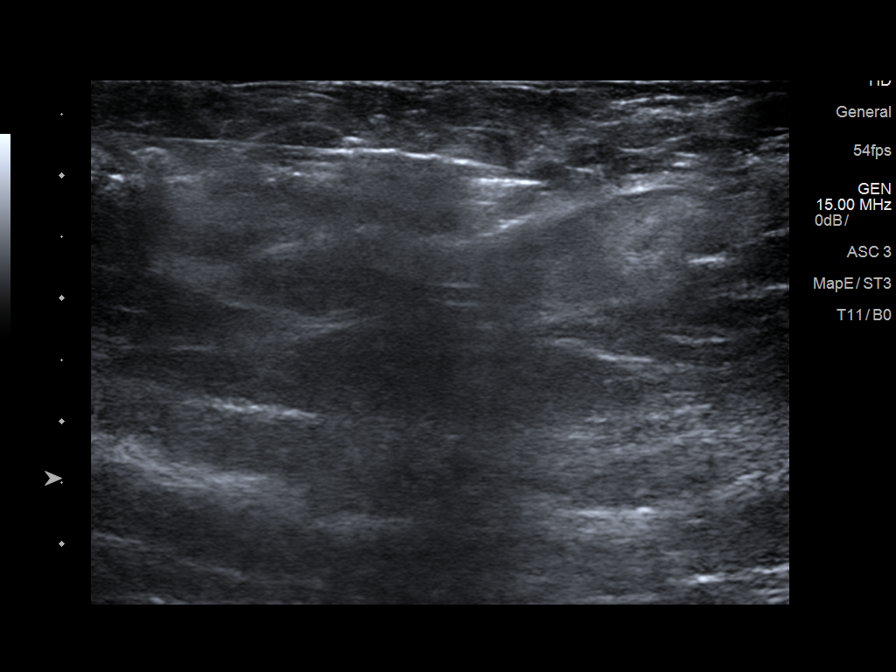
[im 14/17]
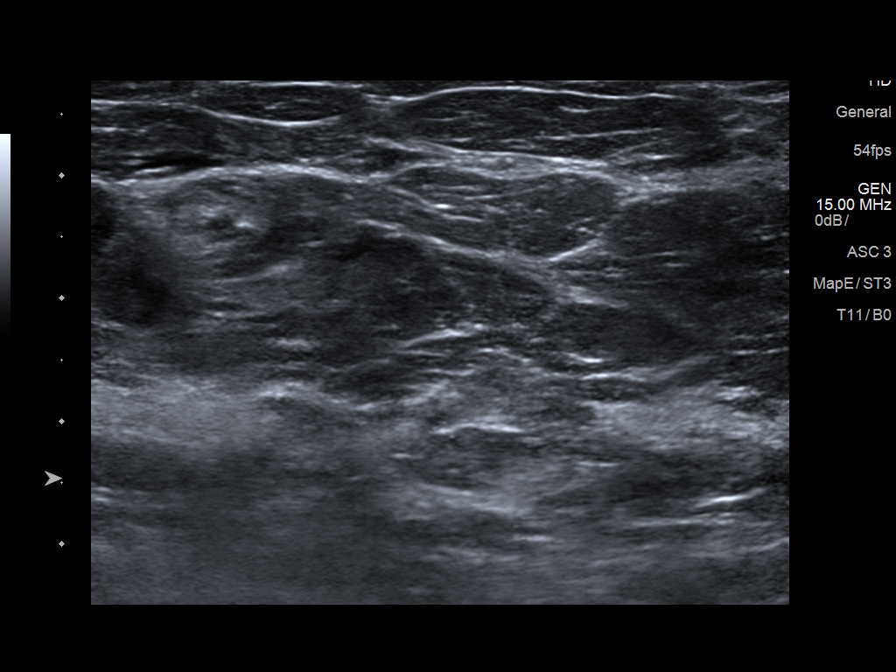
[im 17/17]
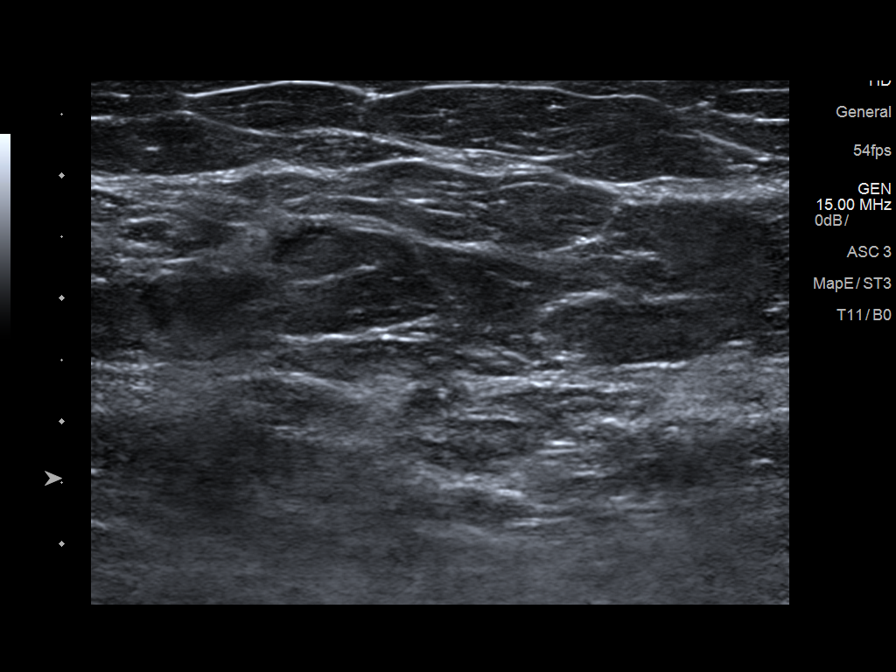

[8 of 8 positions shown; findings below may reference images not displayed]

On evaluation of the breast prior to biopsy, the 9 o'clock, 3 cm
from the nipple, focally dilated duct was visualized without
difficulty. However, the 9 o'clock, 6 cm from the nipple, lesion
could not be confidently seen. A subtle lesion in the correct
location was initially visualized and 1 biopsy attempt was made
which point lesion could no longer be seen.

EXAM:
ULTRASOUND GUIDED RIGHT BREAST CORE NEEDLE BIOPSY
FINDINGS: I met with the patient and we discussed the procedure of
ultrasound-guided biopsy, including benefits and alternatives. We
discussed the high likelihood of a successful procedure. We
discussed the risks of the procedure, including infection, bleeding,
tissue injury, clip migration, and inadequate sampling. Informed
written consent was given. The usual time-out protocol was performed
immediately prior to the procedure.

Lesion #1 quadrant: Upper outer quadrant 9 o'clock, 3 cm the nipple.

Using sterile technique and 1% Lidocaine as local anesthetic, under
direct ultrasound visualization, a 12 gauge [place] device was
used to perform biopsy of the dilated duct at 9 o'clock, 3 cm the
nipple using a lateral approach. At the conclusion of the procedure
a ribbon shaped tissue marker clip was deployed into the biopsy
cavity.

Lesion #2 quadrant: Upper outer quadrant 9 o'clock, 6 cm the nipple.

Using sterile technique and 1% Lidocaine as local anesthetic, under
direct ultrasound visualization, a 12 gauge [place] device was
used to perform biopsy of a vague posterior lesion measuring
approximately 5 mm using a lateral approach. After 1 pass, it was
unclear whether the lesion was sampled, and the lesion could no
longer be visualized. No clip was placed.

Follow up 2 view mammogram was performed and dictated separately.
IMPRESSION: Ultrasound guided biopsy of the right breast. No apparent
complications.

ADDENDUM:
Pathology revealed DUCTAL PAPILLOMA, MULTIPLE FRAGMENTS of the Right
breast, 9 o'clock, 5cmfn. This was found to be concordant by Dr.
Sallima Sdi, with excision recommended.

Pathology revealed BENIGN BREAST PARENCHYMA of the Right breast, 9
o'clock, 0cmfn. This was found to be concordant by Dr. Sallima Sdi.

Pathology results were discussed with the patient by telephone. The
patient reported doing well after the biopsies with tenderness at
the sites. Post biopsy instructions and care were reviewed and
questions were answered. The patient was encouraged to call The

Surgical consultation has been arranged with Dr. Armondo Stromberg at
[REDACTED], per patient request, on October 18, 2018.

The patient was asked to return for right diagnostic mammography and
ultrasound in 6 months and informed a reminder notice would be sent
regarding this appointment.

Pathology results reported by Don Lolito Onacram, RN on 10/11/2018.

*** End of Addendum ***

## 2020-12-07 ENCOUNTER — Other Ambulatory Visit: Payer: Self-pay | Admitting: Surgery

## 2020-12-13 ENCOUNTER — Other Ambulatory Visit: Payer: Self-pay | Admitting: Surgery

## 2020-12-13 DIAGNOSIS — D369 Benign neoplasm, unspecified site: Secondary | ICD-10-CM

## 2022-06-27 ENCOUNTER — Other Ambulatory Visit: Payer: Self-pay | Admitting: Adult Health Nurse Practitioner

## 2022-06-27 DIAGNOSIS — D241 Benign neoplasm of right breast: Secondary | ICD-10-CM

## 2022-08-03 ENCOUNTER — Other Ambulatory Visit: Payer: BC Managed Care – PPO

## 2022-10-04 ENCOUNTER — Ambulatory Visit
Admission: RE | Admit: 2022-10-04 | Discharge: 2022-10-04 | Disposition: A | Discharge status: Home / Self Care | Payer: BC Managed Care – PPO | Source: Ambulatory Visit | Attending: Adult Health Nurse Practitioner | Admitting: Adult Health Nurse Practitioner

## 2022-10-04 ENCOUNTER — Other Ambulatory Visit: Payer: Self-pay | Admitting: Adult Health Nurse Practitioner

## 2022-10-04 DIAGNOSIS — N6310 Unspecified lump in the right breast, unspecified quadrant: Secondary | ICD-10-CM

## 2022-10-04 DIAGNOSIS — R599 Enlarged lymph nodes, unspecified: Secondary | ICD-10-CM

## 2022-10-04 DIAGNOSIS — D241 Benign neoplasm of right breast: Secondary | ICD-10-CM

## 2022-10-10 ENCOUNTER — Ambulatory Visit
Admission: RE | Admit: 2022-10-10 | Discharge: 2022-10-10 | Disposition: A | Discharge status: Home / Self Care | Payer: BC Managed Care – PPO | Source: Ambulatory Visit | Attending: Adult Health Nurse Practitioner | Admitting: Adult Health Nurse Practitioner

## 2022-10-10 DIAGNOSIS — R599 Enlarged lymph nodes, unspecified: Secondary | ICD-10-CM

## 2022-10-10 DIAGNOSIS — N6310 Unspecified lump in the right breast, unspecified quadrant: Secondary | ICD-10-CM

## 2022-10-10 DIAGNOSIS — D241 Benign neoplasm of right breast: Secondary | ICD-10-CM

## 2022-10-10 HISTORY — PX: BREAST BIOPSY: SHX20

## 2024-07-24 ENCOUNTER — Encounter (INDEPENDENT_AMBULATORY_CARE_PROVIDER_SITE_OTHER): Payer: Self-pay | Admitting: *Deleted
